# Patient Record
Sex: Female | Born: 1960 | Race: White | Hispanic: No | Smoking: Current every day smoker
Health system: Southern US, Community
[De-identification: ages and names within clinical notes are randomized; demographics above are authoritative.]

## PROBLEM LIST (undated history)

## (undated) DIAGNOSIS — E890 Postprocedural hypothyroidism: Secondary | ICD-10-CM

## (undated) DIAGNOSIS — I10 Essential (primary) hypertension: Secondary | ICD-10-CM

## (undated) DIAGNOSIS — E0590 Thyrotoxicosis, unspecified without thyrotoxic crisis or storm: Secondary | ICD-10-CM

## (undated) DIAGNOSIS — I2510 Atherosclerotic heart disease of native coronary artery without angina pectoris: Secondary | ICD-10-CM

## (undated) DIAGNOSIS — Z720 Tobacco use: Secondary | ICD-10-CM

## (undated) DIAGNOSIS — E785 Hyperlipidemia, unspecified: Secondary | ICD-10-CM

## (undated) HISTORY — PX: TONSILLECTOMY: SUR1361

---

## 1983-11-04 HISTORY — PX: TUBAL LIGATION: SHX77

## 2010-04-05 ENCOUNTER — Encounter (HOSPITAL_COMMUNITY): Admission: RE | Admit: 2010-04-05 | Discharge: 2010-06-04 | Payer: Self-pay | Admitting: Endocrinology

## 2014-04-26 ENCOUNTER — Observation Stay (HOSPITAL_COMMUNITY)
Admission: EM | Admit: 2014-04-26 | Discharge: 2014-04-27 | Disposition: A | Discharge status: Home / Self Care | Payer: 59 | Attending: Internal Medicine | Admitting: Internal Medicine

## 2014-04-26 ENCOUNTER — Encounter (HOSPITAL_COMMUNITY): Payer: Self-pay | Admitting: Emergency Medicine

## 2014-04-26 ENCOUNTER — Emergency Department (HOSPITAL_COMMUNITY): Payer: 59

## 2014-04-26 DIAGNOSIS — R079 Chest pain, unspecified: Secondary | ICD-10-CM | POA: Diagnosis present

## 2014-04-26 DIAGNOSIS — F17200 Nicotine dependence, unspecified, uncomplicated: Secondary | ICD-10-CM | POA: Insufficient documentation

## 2014-04-26 DIAGNOSIS — Z23 Encounter for immunization: Secondary | ICD-10-CM | POA: Insufficient documentation

## 2014-04-26 DIAGNOSIS — R0789 Other chest pain: Principal | ICD-10-CM | POA: Insufficient documentation

## 2014-04-26 DIAGNOSIS — R030 Elevated blood-pressure reading, without diagnosis of hypertension: Secondary | ICD-10-CM | POA: Insufficient documentation

## 2014-04-26 DIAGNOSIS — E0590 Thyrotoxicosis, unspecified without thyrotoxic crisis or storm: Secondary | ICD-10-CM | POA: Insufficient documentation

## 2014-04-26 DIAGNOSIS — M7989 Other specified soft tissue disorders: Secondary | ICD-10-CM | POA: Insufficient documentation

## 2014-04-26 DIAGNOSIS — Z720 Tobacco use: Secondary | ICD-10-CM | POA: Diagnosis present

## 2014-04-26 DIAGNOSIS — Z79899 Other long term (current) drug therapy: Secondary | ICD-10-CM | POA: Insufficient documentation

## 2014-04-26 DIAGNOSIS — I10 Essential (primary) hypertension: Secondary | ICD-10-CM | POA: Diagnosis present

## 2014-04-26 DIAGNOSIS — E785 Hyperlipidemia, unspecified: Secondary | ICD-10-CM | POA: Diagnosis present

## 2014-04-26 DIAGNOSIS — R0602 Shortness of breath: Secondary | ICD-10-CM | POA: Insufficient documentation

## 2014-04-26 DIAGNOSIS — E890 Postprocedural hypothyroidism: Secondary | ICD-10-CM | POA: Diagnosis present

## 2014-04-26 HISTORY — DX: Tobacco use: Z72.0

## 2014-04-26 HISTORY — DX: Hyperlipidemia, unspecified: E78.5

## 2014-04-26 HISTORY — DX: Thyrotoxicosis, unspecified without thyrotoxic crisis or storm: E05.90

## 2014-04-26 HISTORY — DX: Postprocedural hypothyroidism: E89.0

## 2014-04-26 HISTORY — DX: Essential (primary) hypertension: I10

## 2014-04-26 LAB — BASIC METABOLIC PANEL WITH GFR
BUN: 13 mg/dL (ref 6–23)
CO2: 26 meq/L (ref 19–32)
Calcium: 10.2 mg/dL (ref 8.4–10.5)
Chloride: 102 meq/L (ref 96–112)
Creatinine, Ser: 0.64 mg/dL (ref 0.50–1.10)
GFR calc Af Amer: >90 mL/min
GFR calc non Af Amer: >90 mL/min
Glucose, Bld: 102 mg/dL — ABNORMAL HIGH (ref 70–99)
Potassium: 3.9 meq/L (ref 3.7–5.3)
Sodium: 141 meq/L (ref 137–147)

## 2014-04-26 LAB — CBC
HCT: 40.8 % (ref 36.0–46.0)
Hemoglobin: 13.4 g/dL (ref 12.0–15.0)
MCH: 28.5 pg (ref 26.0–34.0)
MCHC: 32.8 g/dL (ref 30.0–36.0)
MCV: 86.8 fL (ref 78.0–100.0)
Platelets: 272 10*3/uL (ref 150–400)
RBC: 4.7 MIL/uL (ref 3.87–5.11)
RDW: 12.9 % (ref 11.5–15.5)
WBC: 9.4 10*3/uL (ref 4.0–10.5)

## 2014-04-26 LAB — TROPONIN I

## 2014-04-26 MED ORDER — SODIUM CHLORIDE 0.9 % IV SOLN: 250.0000 mL | INTRAVENOUS | Status: DC | PRN

## 2014-04-26 MED ORDER — ASPIRIN 81 MG PO CHEW
324.0000 mg | CHEWABLE_TABLET | Freq: Once | ORAL | Status: AC
  Administered 2014-04-26: 324 mg via ORAL
  Filled 2014-04-26: qty 4

## 2014-04-26 MED ORDER — NITROGLYCERIN 2 % TD OINT
1.0000 [in_us] | TOPICAL_OINTMENT | Freq: Four times a day (QID) | TRANSDERMAL | Status: DC
  Administered 2014-04-26 – 2014-04-27 (×2): 1 [in_us] via TOPICAL
  Filled 2014-04-26 (×2): qty 1

## 2014-04-26 MED ORDER — NICOTINE 14 MG/24HR TD PT24
14.0000 mg | MEDICATED_PATCH | Freq: Every day | TRANSDERMAL | Status: DC
Start: 2014-04-26 — End: 2014-04-27
  Filled 2014-04-26: qty 1

## 2014-04-26 MED ORDER — OXYCODONE HCL 5 MG PO TABS: 5.0000 mg | ORAL_TABLET | ORAL | Status: DC | PRN

## 2014-04-26 MED ORDER — SODIUM CHLORIDE 0.9 % IJ SOLN: 3.0000 mL | INTRAMUSCULAR | Status: DC | PRN

## 2014-04-26 MED ORDER — SODIUM CHLORIDE 0.9 % IJ SOLN
3.0000 mL | Freq: Two times a day (BID) | INTRAMUSCULAR | Status: DC
Start: 2014-04-26 — End: 2014-04-27
  Administered 2014-04-27: 3 mL via INTRAVENOUS

## 2014-04-26 MED ORDER — LEVOTHYROXINE SODIUM 50 MCG PO TABS
50.0000 ug | ORAL_TABLET | Freq: Every day | ORAL | Status: DC
  Administered 2014-04-27: 50 ug via ORAL
  Filled 2014-04-26: qty 1

## 2014-04-26 MED ORDER — SODIUM CHLORIDE 0.9 % IJ SOLN
3.0000 mL | Freq: Two times a day (BID) | INTRAMUSCULAR | Status: DC
Start: 2014-04-26 — End: 2014-04-27
  Administered 2014-04-26: 3 mL via INTRAVENOUS

## 2014-04-26 MED ORDER — HYDRALAZINE HCL 20 MG/ML IJ SOLN
5.0000 mg | Freq: Four times a day (QID) | INTRAMUSCULAR | Status: DC | PRN
  Administered 2014-04-26: 5 mg via INTRAVENOUS
  Filled 2014-04-26: qty 1

## 2014-04-26 MED ORDER — ACETAMINOPHEN 325 MG PO TABS
650.0000 mg | ORAL_TABLET | Freq: Four times a day (QID) | ORAL | Status: DC | PRN
  Administered 2014-04-27: 650 mg via ORAL
  Filled 2014-04-26: qty 2

## 2014-04-26 MED ORDER — HYDROMORPHONE HCL PF 1 MG/ML IJ SOLN: 0.5000 mg | INTRAMUSCULAR | Status: DC | PRN

## 2014-04-26 MED ORDER — ACETAMINOPHEN 650 MG RE SUPP: 650.0000 mg | Freq: Four times a day (QID) | RECTAL | Status: DC | PRN

## 2014-04-26 MED ORDER — ALUM & MAG HYDROXIDE-SIMETH 200-200-20 MG/5ML PO SUSP: 30.0000 mL | Freq: Four times a day (QID) | ORAL | Status: DC | PRN

## 2014-04-26 MED ORDER — ONDANSETRON HCL 4 MG/2ML IJ SOLN: 4.0000 mg | Freq: Four times a day (QID) | INTRAMUSCULAR | Status: DC | PRN

## 2014-04-26 MED ORDER — ASPIRIN EC 325 MG PO TBEC
325.0000 mg | DELAYED_RELEASE_TABLET | Freq: Every day | ORAL | Status: DC
Start: 2014-04-27 — End: 2014-04-27
  Administered 2014-04-27: 325 mg via ORAL
  Filled 2014-04-26: qty 1

## 2014-04-26 MED ORDER — ONDANSETRON HCL 4 MG PO TABS: 4.0000 mg | ORAL_TABLET | Freq: Four times a day (QID) | ORAL | Status: DC | PRN

## 2014-04-26 MED ORDER — ENOXAPARIN SODIUM 40 MG/0.4ML ~~LOC~~ SOLN
40.0000 mg | SUBCUTANEOUS | Status: DC
  Administered 2014-04-26: 40 mg via SUBCUTANEOUS
  Filled 2014-04-26: qty 0.4

## 2014-04-26 MED ORDER — PNEUMOCOCCAL VAC POLYVALENT 25 MCG/0.5ML IJ INJ
0.5000 mL | INJECTION | INTRAMUSCULAR | Status: AC
  Administered 2014-04-27: 0.5 mL via INTRAMUSCULAR
  Filled 2014-04-26: qty 0.5

## 2014-04-26 NOTE — ED Notes (Signed)
Pt denies pain at present.  Reporting she experiences intermittent pain, particularly when walking and carrying something or up an incline.  States that pain is a "burning tightness" and describes it in mid to left side of chest. No distress at present time.

## 2014-04-26 NOTE — ED Notes (Signed)
Left sided cp, intermittent, with mild sob x 2 wks.  Denies n/v/d/dizziness.

## 2014-04-26 NOTE — ED Provider Notes (Signed)
This chart was scribed for [place], DO, by Neta Ehlers, ED Scribe. This patient was seen in room APA12/APA12.   TIME SEEN: 8:32 PM  CHIEF COMPLAINT: Chest Pain    HPI:  Maureen Martin is a 53 y.o. female with a h/o hyperthyroidism who presents to the Emergency Department complaining of central and left-sided chest pain which radiates to her neck. The pt reports the chest pain has occurred intermittently for the past two weeks, but worsened this morning. Ms. Maureen Martin reports the chest pain causes her to sit down and she characterizes the pain as a "fist in the chest" as well as "heaviness."  The pt states the chest pain occurs with exertion; she denies experiencing the chest pain at rest. She reports associated diaphoresis, minimal shortness of breath, and mild swelling to her ankles, most noticeable in the evening. She denies radiation of the chest pain to her jaw. She also denies nausea, emesis, dizziness, a fever, or a cough. Denies a prior history of cardiac stress test. Furthermore, she denies a h/o DVT or PE,, fractures, long travels, surgeries, or hospitalizations. Not on exogenous estrogen. The pt currently smokes a pack a day. Ms. Maureen Martin states her paternal grandmother had an MI at age of 11.  Dr. Monico Blitz is her PCP.   ROS: See HPI Constitutional: positive diaphoresis, no fever  Eyes: no drainage  ENT: no runny nose   Cardiovascular:  chest pain  Resp: positive mild SOB, no cough GI: no vomiting, no nausea  GU: no dysuria Integumentary: no rash  Allergy: no hives  Musculoskeletal: positive lower leg swelling  Neurological: no dizziness, no slurred speech ROS otherwise negative  PAST MEDICAL HISTORY/PAST SURGICAL HISTORY:  Past Medical History  Diagnosis Date  . Hyperthyroidism     MEDICATIONS:  Prior to Admission medications   Medication Sig Start Date End Date Taking? Authorizing Irean Kendricks  Aspirin-Acetaminophen-Caffeine (GOODY HEADACHE PO) Take 1 packet by  mouth daily as needed (for pain).   Yes Historical Karenna Romanoff, MD  levothyroxine (SYNTHROID, LEVOTHROID) 50 MCG tablet Take 50 mcg by mouth daily.   Yes Historical Deleon Passe, MD    ALLERGIES:  Allergies  Allergen Reactions  . Prednisone Other (See Comments)    Made aggressive.    SOCIAL HISTORY:  History  Substance Use Topics  . Smoking status: Current Every Day Smoker    Types: Cigarettes  . Smokeless tobacco: Not on file  . Alcohol Use: No    FAMILY HISTORY: No family history on file.  EXAM: Triage Vitals: BP 177/84  Pulse 90  Temp(Src) 98 F (36.7 C) (Oral)  Resp 16  Ht 5\' 5"  (1.651 m)  Wt 200 lb (90.719 kg)  BMI 33.28 kg/m2  SpO2 96%  CONSTITUTIONAL: Alert and oriented and responds appropriately to questions. Well-appearing; well-nourished HEAD: Normocephalic EYES: Conjunctivae clear, PERRL ENT: normal nose; no rhinorrhea; moist mucous membranes; pharynx without lesions noted NECK: Supple, no meningismus, no LAD  CARD: RRR; S1 and S2 appreciated; no murmurs, no clicks, no rubs, no gallops RESP: Normal chest excursion without splinting or tachypnea; breath sounds clear and equal bilaterally; no wheezes, no rhonchi, no rales, no respiratory distress or hypoxia ABD/GI: Normal bowel sounds; non-distended; soft, non-tender, no rebound, no guarding BACK:  The back appears normal and is non-tender to palpation, there is no CVA tenderness EXT: Normal ROM in all joints; non-tender to palpation; no edema; normal capillary refill; no cyanosis    SKIN: Normal color for age and race; warm NEURO:  Moves all extremities equally PSYCH: The patient's mood and manner are appropriate. Grooming and personal hygiene are appropriate.  MEDICAL DECISION MAKING: Patient here with concerning story for ACS. She has no risk factors for pulmonary embolus. She has history of tobacco use, family history of cardiac disease. Labs are unremarkable. Troponin negative. EKG shows no ischemic changes.  Chest x-ray clear. I feel she needs admission for ACS rule out. Patient agrees.  ED PROGRESS: Discussed with Dr. Isaiah Serge with hospitalist service for admission for ACS rule out.     EKG Interpretation  Date/Time:  Wednesday April 26 2014 18:53:11 EDT Ventricular Rate:  86 PR Interval:  186 QRS Duration: 72 QT Interval:  348 QTC Calculation: 416 R Axis:   77 Text Interpretation:  Normal sinus rhythm Low voltage QRS Borderline ECG Confirmed by WARD,  DO, KRISTEN ([id number]) on 04/26/2014 7:21:04 PM         [place], DO 04/26/14 2112

## 2014-04-26 NOTE — H&P (Signed)
[facility] History and Physical  Maureen Martin ZOX:[id number] DOB: Sep 21, 1961 DOA: 04/26/2014  Referring physician:  EDP PCP: Monico Blitz, MD  Specialists:   Chief Complaint: Chest Pain  HPI: Maureen Martin is a 53 y.o. female with a history of Hyperthyroid S/P RAI Rx and now Hypothyroid who presents to the ED with complaints of 2 weeks of intermittent chest pain.  The chest pain is located in the left side of her chest and radiates into her neck and is associated with SOB and Diaphoresis.  The pain lasts for several minutes, and she describes the pain as heaviness and chest tightness.   She reports the pain occurs with exertion and improves with rest.   She rates the pain at a 7/10 at the worse.  She was evaluated in the ED and referred for a Cardiac rule out.       Review of Systems:  Constitutional: No Weight Loss, No Weight Gain, Night Sweats, Fevers, Chills, Fatigue, or Generalized Weakness HEENT: No Headaches, Difficulty Swallowing,Tooth/Dental Problems,Sore Throat,  No Sneezing, Rhinitis, Ear Ache, Nasal Congestion, or Post Nasal Drip,  Cardio-vascular:  +Chest pain, Orthopnea, PND, Edema in lower extremities, Anasarca, Dizziness, Palpitations  Resp: +Dyspnea, No DOE, No Cough, No Hemoptysis,  No Wheezing.    GI: No Heartburn, Indigestion, Abdominal Pain, Nausea, Vomiting, Diarrhea, Change in Bowel Habits,  Loss of Appetite  GU: No Dysuria, Change in Color of Urine, No Urgency or Frequency.  No flank pain.  Musculoskeletal: No Joint Pain or Swelling.  No Decreased Range of Motion. No Back Pain.  Neurologic: No Syncope, No Seizures, Muscle Weakness, Paresthesia, Vision Disturbance or Loss, No Diplopia, No Vertigo, No Difficulty Walking,  Skin: No Rash or Lesions. Psych: No Change in Mood or Affect. No Depression or Anxiety. No Memory loss. No Confusion or Hallucinations   Past Medical History  Diagnosis Date  . Hyperthyroidism     Past Surgical History  Procedure  Laterality Date  . Cesarean section      x 2  . Tonsillectomy       Prior to Admission medications   Medication Sig Start Date End Date Taking? Authorizing Provider  Aspirin-Acetaminophen-Caffeine (GOODY HEADACHE PO) Take 1 packet by mouth daily as needed (for pain).   Yes Historical Provider, MD  levothyroxine (SYNTHROID, LEVOTHROID) 50 MCG tablet Take 50 mcg by mouth daily.   Yes Historical Provider, MD    Allergies  Allergen Reactions  . Prednisone Other (See Comments)    Made aggressive.    Social History:  reports that she has been smoking Cigarettes.  She has been smoking about 0.00 packs per day. She does not have any smokeless tobacco history on file. She reports that she does not drink alcohol or use illicit drugs.     Family History:     CAD in  Paternal [place] mother   CAD in  Father    Physical Exam:  GEN:  Pleasant Obese  53 y.o. Caucasian female examined and in no acute distress; cooperative with exam Filed Vitals:   04/26/14 1858  BP: 177/84  Pulse: 90  Temp: 98 F (36.7 C)  TempSrc: Oral  Resp: 16  Height: 5\' 5"  (1.651 m)  Weight: 90.719 kg (200 lb)  SpO2: 96%   Blood pressure 177/84, pulse 90, temperature 98 F (36.7 C), temperature source Oral, resp. rate 16, height 5\' 5"  (1.651 m), weight 90.719 kg (200 lb), SpO2 96.00%. PSYCH: She is alert and oriented x4; does not appear anxious  does not appear depressed; affect is normal HEENT: Normocephalic and Atraumatic, Mucous membranes pink; PERRLA; EOM intact; Fundi:  Benign;  No scleral icterus, Nares: Patent, Oropharynx: Clear, Fair Dentition, Neck:  FROM, no cervical lymphadenopathy nor thyromegaly or carotid bruit; no JVD; Breasts:: Not examined CHEST WALL: No tenderness CHEST: Normal respiration, clear to auscultation bilaterally HEART: Regular rate and rhythm; no murmurs rubs or gallops BACK: No kyphosis or scoliosis; no CVA tenderness ABDOMEN: Positive Bowel Sounds, Obese, soft non-tender; no  masses, no organomegaly. Rectal Exam: Not done EXTREMITIES: No cyanosis, clubbing or edema; no ulcerations. Genitalia: not examined PULSES: 2+ and symmetric SKIN: Normal hydration no rash or ulceration CNS:  Alert and Oriented x 4, No Focal Deficits.    Vascular: 2+ pulses palpable throughout     Labs on Admission:  Basic Metabolic Panel:  Recent Labs Lab 04/26/14 1915  NA 141  K 3.9  CL 102  CO2 26  GLUCOSE 102*  BUN 13  CREATININE 0.64  CALCIUM 10.2   Liver Function Tests: No results found for this basename: AST, ALT, ALKPHOS, BILITOT, PROT, ALBUMIN,  in the last 168 hours No results found for this basename: LIPASE, AMYLASE,  in the last 168 hours No results found for this basename: AMMONIA,  in the last 168 hours CBC:  Recent Labs Lab 04/26/14 1915  WBC 9.4  HGB 13.4  HCT 40.8  MCV 86.8  PLT 272   Cardiac Enzymes:  Recent Labs Lab 04/26/14 1915  TROPONINI <0.30    BNP (last 3 results) No results found for this basename: PROBNP,  in the last 8760 hours CBG: No results found for this basename: GLUCAP,  in the last 168 hours  Radiological Exams on Admission: Dg Chest 2 View  04/26/2014   CLINICAL DATA:  Intermittent chest pain.  EXAM: CHEST  2 VIEW  COMPARISON:  None.  FINDINGS: The cardiac silhouette, mediastinal and hilar contours are within normal limits. There are emphysematous and bronchitic lung changes likely related to smoking. No infiltrates, edema or effusions. The bony thorax is intact.  IMPRESSION: Mild emphysematous and bronchitic type lung changes, likely related to smoking.  No acute pulmonary findings.   Electronically Signed   By: Kalman Jewels M.D.   On: 04/26/2014 19:25     EKG: Independently reviewed. Normal Sinus Rhythm  Rate =86,  ST Depression in Infero-Lateral leads.     Assessment/Plan:   53 y.o. female with  Principal Problem:   Chest pain Active Problems:   Elevated blood pressure reading without diagnosis of  hypertension   Hypothyroidism, postradioiodine therapy   Tobacco abuse     1.   Chest pain-  Telemetry Monitoring,  Cycle Troponins,  Apply Nitropaste 1 inch q 6 hrs (with Hold parameters if SBP < 100), Administer ASA and Oxygen Rx.      Further Risk Stratification:  Check Fasting Lipids, and HbA1c.  Counsel and Advise Tobacco Cessation.   2 D ECHO study in AM.     Stress Testing as Inpatient vs Outpatient.    2.   Elevated Blood Pressure without Dx of HTN-   Monitor,  May be reactive.  Control pain, and NTG paste ordered .    3.   Hypothyroid-  Continue Levothyroxine , and check TSH level.    4.   Tobacco Abuse- Counseled, is Contemplative,  Nicotine patch while in hospital.     5.  DVT Prophylaxis with Lovenox.       Code Status:  FULL CODE Family Communication:    No Family Present Disposition Plan:    Observation  Time spent:  [address] [place] [address] [facility] Pager [phone]  If 7PM-7AM, please contact night-coverage [email or web address] Password [facility] 04/26/2014, 9:36 PM

## 2014-04-26 NOTE — ED Notes (Signed)
Hospitalist at bedside 

## 2014-04-27 ENCOUNTER — Encounter (HOSPITAL_COMMUNITY): Payer: Self-pay | Admitting: Internal Medicine

## 2014-04-27 DIAGNOSIS — I10 Essential (primary) hypertension: Secondary | ICD-10-CM | POA: Diagnosis present

## 2014-04-27 DIAGNOSIS — E785 Hyperlipidemia, unspecified: Secondary | ICD-10-CM

## 2014-04-27 DIAGNOSIS — I517 Cardiomegaly: Secondary | ICD-10-CM

## 2014-04-27 HISTORY — DX: Essential (primary) hypertension: I10

## 2014-04-27 HISTORY — DX: Hyperlipidemia, unspecified: E78.5

## 2014-04-27 LAB — BASIC METABOLIC PANEL
BUN: 11 mg/dL (ref 6–23)
CALCIUM: 9.3 mg/dL (ref 8.4–10.5)
CHLORIDE: 102 meq/L (ref 96–112)
CO2: 27 meq/L (ref 19–32)
CREATININE: 0.66 mg/dL (ref 0.50–1.10)
GFR calc Af Amer: >90 mL/min (ref >90)
GFR calc non Af Amer: >90 mL/min (ref >90)
GLUCOSE: 95 mg/dL (ref 70–99)
Potassium: 3.8 mEq/L (ref 3.7–5.3)
Sodium: 141 mEq/L (ref 137–147)

## 2014-04-27 LAB — CBC
HEMATOCRIT: 39.1 % (ref 36.0–46.0)
Hemoglobin: 12.8 g/dL (ref 12.0–15.0)
MCH: 28.6 pg (ref 26.0–34.0)
MCHC: 32.7 g/dL (ref 30.0–36.0)
MCV: 87.5 fL (ref 78.0–100.0)
PLATELETS: 247 10*3/uL (ref 150–400)
RBC: 4.47 MIL/uL (ref 3.87–5.11)
RDW: 13 % (ref 11.5–15.5)
WBC: 9.4 10*3/uL (ref 4.0–10.5)

## 2014-04-27 LAB — LIPID PANEL
CHOLESTEROL: 269 mg/dL — AB (ref 0–200)
HDL: 43 mg/dL (ref >39)
LDL Cholesterol: 189 mg/dL — ABNORMAL HIGH (ref 0–99)
Total CHOL/HDL Ratio: 6.3 RATIO
Triglycerides: 183 mg/dL — ABNORMAL HIGH (ref <150)
VLDL: 37 mg/dL (ref 0–40)

## 2014-04-27 LAB — TROPONIN I
Troponin I: <0.3 ng/mL (ref <0.30)
Troponin I: <0.3 ng/mL (ref <0.30)

## 2014-04-27 LAB — HEMOGLOBIN A1C
Hgb A1c MFr Bld: 5.7 % — ABNORMAL HIGH (ref <5.7)
Mean Plasma Glucose: 117 mg/dL — ABNORMAL HIGH (ref <117)

## 2014-04-27 LAB — TSH: TSH: 6.53 u[IU]/mL — ABNORMAL HIGH (ref 0.350–4.500)

## 2014-04-27 MED ORDER — ATORVASTATIN CALCIUM 10 MG PO TABS: 10.0000 mg | ORAL_TABLET | Freq: Every day | ORAL | Status: DC

## 2014-04-27 MED ORDER — ASPIRIN EC 81 MG PO TBEC: 81.0000 mg | DELAYED_RELEASE_TABLET | Freq: Every day | ORAL | Status: DC

## 2014-04-27 MED ORDER — ACETAMINOPHEN 500 MG PO TABS
500.0000 mg | ORAL_TABLET | Freq: Four times a day (QID) | ORAL | Status: AC | PRN
Start: 2014-04-27 — End: ?

## 2014-04-27 MED ORDER — SIMVASTATIN 20 MG PO TABS: 20.0000 mg | ORAL_TABLET | Freq: Every day | ORAL | Status: DC

## 2014-04-27 MED ORDER — PANTOPRAZOLE SODIUM 40 MG PO TBEC: 40.0000 mg | DELAYED_RELEASE_TABLET | Freq: Every day | ORAL | Status: DC

## 2014-04-27 MED ORDER — HYDROCHLOROTHIAZIDE 12.5 MG PO CAPS: 12.5000 mg | ORAL_CAPSULE | Freq: Every day | ORAL | Status: DC

## 2014-04-27 MED ORDER — HYDROCHLOROTHIAZIDE 12.5 MG PO CAPS
12.5000 mg | ORAL_CAPSULE | Freq: Every day | ORAL | Status: DC
  Administered 2014-04-27: 12.5 mg via ORAL
  Filled 2014-04-27: qty 1

## 2014-04-27 NOTE — Progress Notes (Signed)
Patient with orders to be discharge home. Discharge instructions given, patient verbalized understanding. Patient stable. Patient left in private vehicle with daughter.  

## 2014-04-27 NOTE — Progress Notes (Signed)
*  PRELIMINARY RESULTS* Echocardiogram TTE  has been performed.  Maureen Martin 04/27/2014, 12:28 PM

## 2014-04-27 NOTE — Plan of Care (Signed)
Problem: Consults Goal: Chest Pain Patient Education (See Patient Education module for education specifics.)  Outcome: Progressing Observation to rule out MI, troponins negative x2 at present, no complaints of pain since admission to the floor Goal: Tobacco Cessation referral if indicated Outcome: Progressing Patient states she has quit several times in the past and presently smoking. Goal: Nutrition Consult-if indicated Outcome: Progressing Patient is obese and poor eating habits  Problem: Phase I Progression Outcomes Goal: Hemodynamically stable Outcome: Progressing Admitted with hypertension, hydralazine 74m one time IV given, no further issues with BP Goal: Anginal pain relieved Outcome: Completed/Met Date Met:  04/27/14 No complaints of pain on the floor Goal: Aspirin unless contraindicated Outcome: Completed/Met Date Met:  04/27/14 Given in ED 3252m chewable

## 2014-04-27 NOTE — Progress Notes (Signed)
Utilization review completed.  

## 2014-04-30 NOTE — Discharge Summary (Signed)
Physician Discharge Summary  Maureen Martin VZD:[id number] DOB: 07-Jun-1961 DOA: 04/26/2014  PCP: Monico Blitz, MD  Admit date: 04/26/2014 Discharge date: 04/27/2014  Time spent: Greater than 30 minutes  Recommendations for Outpatient Follow-up:  1. Echo and TSH pending at the time of discharge.    Discharge Diagnoses:  1. Left-sided chest pain; etiology unknown; MI ruled out. 2. Hypertension 3.Hypothyroidism, postradioiodine therapy 4.Tobacco abuse; advised to stop smoking. 5. Hyperlipidemia; total cholesterol 259; TG 183; HDL 43; LDL 189 6. Chronic NSAID use for chronic headaches   Discharge Condition: Improved  Diet recommendation: Herat healthy  Filed Weights   04/26/14 1858 04/26/14 2228  Weight: 90.719 kg (200 lb) 92.5 kg (203 lb 14.8 oz)    History of present illness:  Maureen Martin is a 53 y.o. female with a history of Hyperthyroid S/P RAI Rx and now Hypothyroid who presented to the ED with complaints of 2 weeks of intermittent chest pain. The chest pain was located in the left side of her chest and radiated into her neck and was associated with SOB and Diaphoresis. The pain lasted for several minutes. She described the pain as heaviness and chest tightness. She reported the pain occurs with exertion and improves with rest. She rated the pain at a 7/10 at the worse. She was evaluated in the ED and referred for a cardiac rule out.   Hospital Course:  The patient was afebrile and oxygenating 96-100% on room air.Her blood pressure was elevated at 177/84. She denied any prior history of hypertension. Management was started with Nitropaste, aspirin, oxygen and as needed Dilaudid. Synthroid was continued. A nicotine patch was placed and she was counseled on tobacco cessation. As needed IV Hydralazine was also ordered. For further evaluation, a number of studies were ordered. Her Troponin I was negative x 4. Her A1c was within normal limits at 5.7. The results of her lipid  profile were dictated above. She was started on statin therapy. The results of her TSH and echo were pending at the time of discharge. The patient was informed that the dictating physician would follow up on the results when available.  She became chest pain free and Nitropaste was discontinued. Her blood pressure improved, but 12.5 of HCTZ was added for treatment of potential HTN. Further questioning revealed that the patient had been taking Goody's powder for headaches. She was instructed to wean herself off of Goody's powder and to try extra strength Tylenol instead. Protonix was prescribed at the time of discharge. An outpatient cardiology was scheduled for further evaluation.   Procedures:  2-D echo. Pending.  Consultations:  None  Discharge Exam: Filed Vitals:   04/27/14 0621  BP: 131/72  Pulse: 60  Temp: 97.7 F (36.5 C)  Resp: 20    General: No acute distress Cardiovascular: S1 S2 with no murmurs, rubs or gallops. Respiratory: CTA bilaterally  Extremities: no pedal edema; no calf tenderness.  Discharge Instructions You were cared for by a hospitalist during your hospital stay. If you have any questions about your discharge medications or the care you received while you were in the hospital after you are discharged, you can call the unit and asked to speak with the hospitalist on call if the hospitalist that took care of you is not available. Once you are discharged, your primary care physician will handle any further medical issues. Please note that NO REFILLS for any discharge medications will be authorized once you are discharged, as it is imperative that you return to  your primary care physician (or establish a relationship with a primary care physician if you do not have one) for your aftercare needs so that they can reassess your need for medications and monitor your lab values.  Discharge Instructions   Diet - low sodium heart healthy    Complete by:  As directed       Discharge instructions    Complete by:  As directed   Stop smoking. Wean yourself off of Goody's Powders.     Increase activity slowly    Complete by:  As directed             Medication List    STOP taking these medications       GOODY HEADACHE PO      TAKE these medications       acetaminophen 500 MG tablet  Commonly known as:  TYLENOL  Take 1-2 tablets (500-1,000 mg total) by mouth every 6 (six) hours as needed.     aspirin EC 81 MG tablet  Take 1 tablet (81 mg total) by mouth daily.     hydrochlorothiazide 12.5 MG capsule  Commonly known as:  MICROZIDE  Take 1 capsule (12.5 mg total) by mouth daily.     levothyroxine 50 MCG tablet  Commonly known as:  SYNTHROID, LEVOTHROID  Take 50 mcg by mouth daily.     pantoprazole 40 MG tablet  Commonly known as:  PROTONIX  Take 1 tablet (40 mg total) by mouth daily.     simvastatin 20 MG tablet  Commonly known as:  ZOCOR  Take 1 tablet (20 mg total) by mouth daily.       Allergies  Allergen Reactions  . Prednisone Other (See Comments)    Made aggressive.       Follow-up Information   Follow up with Arnoldo Lenis, MD On 05/02/2014. (at 9:20 am in [place] office)    Specialty:  Cardiology   Contact information:   [address] [place] [address] [phone]        The results of significant diagnostics from this hospitalization (including imaging, microbiology, ancillary and laboratory) are listed below for reference.    Significant Diagnostic Studies: Dg Chest 2 View  04/26/2014   CLINICAL DATA:  Intermittent chest pain.  EXAM: CHEST  2 VIEW  COMPARISON:  None.  FINDINGS: The cardiac silhouette, mediastinal and hilar contours are within normal limits. There are emphysematous and bronchitic lung changes likely related to smoking. No infiltrates, edema or effusions. The bony thorax is intact.  IMPRESSION: Mild emphysematous and bronchitic type lung changes, likely related to smoking.  No acute  pulmonary findings.   Electronically Signed   By: Kalman Jewels M.D.   On: 04/26/2014 19:25    Microbiology: No results found for this or any previous visit (from the past 240 hour(s)).   Labs: Basic Metabolic Panel:  Recent Labs Lab 04/26/14 1915 04/27/14 0554  NA 141 141  K 3.9 3.8  CL 102 102  CO2 26 27  GLUCOSE 102* 95  BUN 13 11  CREATININE 0.64 0.66  CALCIUM 10.2 9.3   Liver Function Tests: No results found for this basename: AST, ALT, ALKPHOS, BILITOT, PROT, ALBUMIN,  in the last 168 hours No results found for this basename: LIPASE, AMYLASE,  in the last 168 hours No results found for this basename: AMMONIA,  in the last 168 hours CBC:  Recent Labs Lab 04/26/14 1915 04/27/14 0554  WBC 9.4 9.4  HGB 13.4 12.8  HCT 40.8 39.1  MCV 86.8 87.5  PLT 272 247   Cardiac Enzymes:  Recent Labs Lab 04/26/14 1915 04/26/14 2330 04/27/14 0554 04/27/14 1153  TROPONINI <0.30 <0.30 <0.30 <0.30   BNP: BNP (last 3 results) No results found for this basename: PROBNP,  in the last 8760 hours CBG: No results found for this basename: GLUCAP,  in the last 168 hours     Signed:  FISHER,DENISE  [facility] 04/27/2014, 1:00 PM

## 2014-05-02 ENCOUNTER — Ambulatory Visit (INDEPENDENT_AMBULATORY_CARE_PROVIDER_SITE_OTHER): Payer: 59 | Admitting: Cardiology

## 2014-05-02 ENCOUNTER — Encounter: Payer: Self-pay | Admitting: Cardiology

## 2014-05-02 VITALS — BP 131/84 | HR 76 | Ht 66.0 in | Wt 201.0 lb

## 2014-05-02 DIAGNOSIS — R0789 Other chest pain: Secondary | ICD-10-CM

## 2014-05-02 NOTE — Progress Notes (Signed)
Clinical Summary Maureen Martin is a 53 y.o.female seen today as a new patient for the following medical problems.  1. Chest pain - recent admit to [facility] 04/2014 with chest pain - trop neg x 4, EKG non-specific changes - echo 04/27/14 showed LVEF 40-98%, grade I diastolic dysfunction, no WMAs  - episodes of chest pain over 2 weeks, 4 episodes. Burning pressure midchest, 4/10. Can occur at rest or with exertion. No SOB, no palpitations, no N/V. + diaphoresis. Nothing made better or worst. Lasted just a few minutes. Last episode was 04/27/14. - denies any DOE. - started on antacid and has stopped taking frequent goodies powder, has not had any symptoms since these changes.   CAD risk factors: HTN, HL, + tobacco, father MI 7s, maternal grandmother unsure of age.     Past Medical History  Diagnosis Date  . Hyperthyroidism   . Hypothyroidism, postradioiodine therapy   . Tobacco abuse   . Hyperlipidemia 04/27/2014  . HTN (hypertension) 04/27/2014     Allergies  Allergen Reactions  . Prednisone Other (See Comments)    Made aggressive.     Current Outpatient Prescriptions  Medication Sig Dispense Refill  . acetaminophen (TYLENOL) 500 MG tablet Take 1-2 tablets (500-1,000 mg total) by mouth every 6 (six) hours as needed.      Marland Kitchen aspirin EC 81 MG tablet Take 1 tablet (81 mg total) by mouth daily.      . hydrochlorothiazide (MICROZIDE) 12.5 MG capsule Take 1 capsule (12.5 mg total) by mouth daily.  30 capsule  3  . levothyroxine (SYNTHROID, LEVOTHROID) 50 MCG tablet Take 50 mcg by mouth daily.      . pantoprazole (PROTONIX) 40 MG tablet Take 1 tablet (40 mg total) by mouth daily.  30 tablet  3  . simvastatin (ZOCOR) 20 MG tablet Take 1 tablet (20 mg total) by mouth daily.  30 tablet  3   No current facility-administered medications for this visit.     Past Surgical History  Procedure Laterality Date  . Cesarean section      x 2  . Tonsillectomy       Allergies    Allergen Reactions  . Prednisone Other (See Comments)    Made aggressive.      Family History  Problem Relation Age of Onset  . Coronary artery disease Father   . Valvular heart disease Father      Social History Ms. Daw reports that she has been smoking Cigarettes.  She has been smoking about 0.00 packs per day for the past 20 years. She does not have any smokeless tobacco history on file. Ms. Severin reports that she does not drink alcohol.   Review of Systems CONSTITUTIONAL: No weight loss, fever, chills, weakness or fatigue.  HEENT: Eyes: No visual loss, blurred vision, double vision or yellow sclerae.No hearing loss, sneezing, congestion, runny nose or sore throat.  SKIN: No rash or itching.  CARDIOVASCULAR: per HPI RESPIRATORY: No shortness of breath, cough or sputum.  GASTROINTESTINAL: No anorexia, nausea, vomiting or diarrhea. No abdominal pain or blood.  GENITOURINARY: No burning on urination, no polyuria NEUROLOGICAL: No headache, dizziness, syncope, paralysis, ataxia, numbness or tingling in the extremities. No change in bowel or bladder control.  MUSCULOSKELETAL: No muscle, back pain, joint pain or stiffness.  LYMPHATICS: No enlarged nodes. No history of splenectomy.  PSYCHIATRIC: No history of depression or anxiety.  ENDOCRINOLOGIC: No reports of sweating, cold or heat intolerance. No polyuria or polydipsia.  Marland Kitchen  Physical Examination p 76 bp 131/84 Wt 201 lbs BMI 32 Gen: resting comfortably, no acute distress HEENT: no scleral icterus, pupils equal round and reactive, no palptable cervical adenopathy,  CV: RRR, no m/r/g, no JVD, no carotid bruits Resp: Clear to auscultation bilaterally GI: abdomen is soft, non-tender, non-distended, normal bowel sounds, no hepatosplenomegaly MSK: extremities are warm, no edema.  Skin: warm, no rash Neuro:  no focal deficits Psych: appropriate affect   Diagnostic Studies 04/27/14 Echo Study Conclusions  -  Procedure narrative: Transthoracic echocardiography. Image quality was suboptimal, due to poor sound transmission. - Left ventricle: The cavity size was normal. Wall thickness was increased in a pattern of mild LVH. Systolic function was normal. The estimated ejection fraction was in the range of 60% to 65%. Wall motion was normal; there were no regional wall motion abnormalities. Doppler parameters are consistent with abnormal left ventricular relaxation (grade 1 diastolic dysfunction).      Assessment and Plan   1. Chest pain - resolved since stopping goodys powder and starting ppi, overall consistent with likely GI source - no further cardiac workup at this time, I have asked her if symptoms return to contact us and we can consider further cardiac testing at that time  F/u 6 months     Arnoldo Lenis, M.D., F.A.C.C.

## 2014-05-02 NOTE — Patient Instructions (Signed)
Continue all current medications. Your physician wants you to follow up in: 6 months.  You will receive a reminder letter in the mail one-two months in advance.  If you don't receive a letter, please call our office to schedule the follow up appointment   

## 2014-11-15 ENCOUNTER — Ambulatory Visit (INDEPENDENT_AMBULATORY_CARE_PROVIDER_SITE_OTHER): Payer: 59 | Admitting: Cardiology

## 2014-11-15 ENCOUNTER — Encounter: Payer: Self-pay | Admitting: Cardiology

## 2014-11-15 VITALS — BP 138/82 | HR 86 | Ht 66.0 in | Wt 194.0 lb

## 2014-11-15 DIAGNOSIS — R079 Chest pain, unspecified: Secondary | ICD-10-CM

## 2014-11-15 MED ORDER — NITROGLYCERIN 0.4 MG SL SUBL: 0.4000 mg | SUBLINGUAL_TABLET | SUBLINGUAL | Status: AC | PRN

## 2014-11-15 MED ORDER — SIMVASTATIN 20 MG PO TABS: 20.0000 mg | ORAL_TABLET | Freq: Every day | ORAL | Status: DC

## 2014-11-15 NOTE — Patient Instructions (Signed)
Your physician recommends that you schedule a follow-up appointment to be determined after stress test  Your physician has recommended you make the following change in your medication:   [place]  Your physician recommends that you return for lab work CBC/CMP/LIPIDS/TSH  CONTINUE ALL OTHER CURRENT MEDICATIONS  Thank you for choosing [facility]!!

## 2014-11-15 NOTE — Progress Notes (Signed)
Clinical Summary Ms. Mutz is a 54 y.o.female seen today for follow up of the following medical problems.   1. Chest pain - admit to [facility] 04/2014 with chest pain - trop neg x 4, EKG non-specific changes - echo 04/27/14 showed LVEF 93-81%, grade I diastolic dysfunction, no WMAs CAD risk factors: HTN, HL, + tobacco, father MI 78s, maternal grandmother unsure of age.   - at our last visit symptoms had resolved after stopping goodies powder and starting ppi, suspected GI source.  - since then she reports recurrence of symptoms. Dull pain left chest, 6/10. Tends to occur with exertion. No other associated symptoms. Lasts 2 minutes. Not positional. Occurs every 2-3 days. Denies any DOE.   Past Medical History  Diagnosis Date  . Hyperthyroidism   . Hypothyroidism, postradioiodine therapy   . Tobacco abuse   . Hyperlipidemia 04/27/2014  . HTN (hypertension) 04/27/2014     Allergies  Allergen Reactions  . Prednisone Other (See Comments)    Made aggressive.     Current Outpatient Prescriptions  Medication Sig Dispense Refill  . acetaminophen (TYLENOL) 500 MG tablet Take 1-2 tablets (500-1,000 mg total) by mouth every 6 (six) hours as needed.    Marland Kitchen aspirin EC 81 MG tablet Take 1 tablet (81 mg total) by mouth daily.    . hydrochlorothiazide (MICROZIDE) 12.5 MG capsule Take 1 capsule (12.5 mg total) by mouth daily. 30 capsule 3  . levothyroxine (SYNTHROID, LEVOTHROID) 50 MCG tablet Take 50 mcg by mouth daily.    . pantoprazole (PROTONIX) 40 MG tablet Take 1 tablet (40 mg total) by mouth daily. 30 tablet 3  . simvastatin (ZOCOR) 20 MG tablet Take 1 tablet (20 mg total) by mouth daily. 30 tablet 3   No current facility-administered medications for this visit.     Past Surgical History  Procedure Laterality Date  . Cesarean section      x 2  . Tonsillectomy       Allergies  Allergen Reactions  . Prednisone Other (See Comments)    Made aggressive.      Family  History  Problem Relation Age of Onset  . Coronary artery disease Father   . Valvular heart disease Father      Social History Ms. Kandler reports that she has been smoking Cigarettes.  She started smoking about 21 years ago. She has a 15 pack-year smoking history. She has never used smokeless tobacco. Ms. Rubano reports that she does not drink alcohol.   Review of Systems CONSTITUTIONAL: No weight loss, fever, chills, weakness or fatigue.  HEENT: Eyes: No visual loss, blurred vision, double vision or yellow sclerae.No hearing loss, sneezing, congestion, runny nose or sore throat.  SKIN: No rash or itching.  CARDIOVASCULAR: per HPI RESPIRATORY: No shortness of breath, cough or sputum.  GASTROINTESTINAL: No anorexia, nausea, vomiting or diarrhea. No abdominal pain or blood.  GENITOURINARY: No burning on urination, no polyuria NEUROLOGICAL: No headache, dizziness, syncope, paralysis, ataxia, numbness or tingling in the extremities. No change in bowel or bladder control.  MUSCULOSKELETAL: No muscle, back pain, joint pain or stiffness.  LYMPHATICS: No enlarged nodes. No history of splenectomy.  PSYCHIATRIC: No history of depression or anxiety.  ENDOCRINOLOGIC: No reports of sweating, cold or heat intolerance. No polyuria or polydipsia.  Marland Kitchen   Physical Examination p 86 bp 138/82 Wt 194 lbs BMI 31 Gen: resting comfortably, no acute distress HEENT: no scleral icterus, pupils equal round and reactive, no palptable cervical adenopathy,  CV: RRR, no m/r/g, no JVD, no carotid bruits Resp: Clear to auscultation bilaterally GI: abdomen is soft, non-tender, non-distended, normal bowel sounds, no hepatosplenomegaly MSK: extremities are warm, no edema.  Skin: warm, no rash Neuro:  no focal deficits Psych: appropriate affect   Diagnostic Studies 04/2014 Echo Study Conclusions  - Procedure narrative: Transthoracic echocardiography. Image quality was suboptimal, due to poor sound  transmission. - Left ventricle: The cavity size was normal. Wall thickness was increased in a pattern of mild LVH. Systolic function was normal. The estimated ejection fraction was in the range of 60% to 65%. Wall motion was normal; there were no regional wall motion abnormalities. Doppler parameters are consistent with abnormal left ventricular relaxation (grade 1 diastolic dysfunction).    Assessment and Plan  1. Chest pain - initially resolved after stopping goodies powder and starting ppi, however have returned - exact etiology remains unclear, she does have several CAD risk factors - will refer for GXT to further evaluate possible cardiac cause. Given prescription for prn SL NG.  - she has asked that we also check her routine labs, will order   F/u pending stress test results      Arnoldo Lenis, M.D.

## 2014-11-24 ENCOUNTER — Inpatient Hospital Stay (HOSPITAL_COMMUNITY): Admission: RE | Admit: 2014-11-24 | Payer: 59 | Source: Ambulatory Visit

## 2014-11-27 ENCOUNTER — Ambulatory Visit (HOSPITAL_COMMUNITY): Admission: RE | Admit: 2014-11-27 | Payer: 59 | Source: Ambulatory Visit

## 2014-12-01 ENCOUNTER — Ambulatory Visit (HOSPITAL_COMMUNITY)
Admission: RE | Admit: 2014-12-01 | Discharge: 2014-12-01 | Disposition: A | Discharge status: Home / Self Care | Payer: 59 | Source: Ambulatory Visit | Attending: Cardiology | Admitting: Cardiology

## 2014-12-01 ENCOUNTER — Telehealth: Payer: Self-pay | Admitting: *Deleted

## 2014-12-01 ENCOUNTER — Encounter (HOSPITAL_COMMUNITY): Payer: Self-pay

## 2014-12-01 DIAGNOSIS — R079 Chest pain, unspecified: Secondary | ICD-10-CM

## 2014-12-01 DIAGNOSIS — R9439 Abnormal result of other cardiovascular function study: Secondary | ICD-10-CM

## 2014-12-01 NOTE — Telephone Encounter (Signed)
-----   Message from Arnoldo Lenis, MD sent at 12/01/2014 12:22 PM EST ----- Exercise stress test does show some moderate abnormalities suggestive of a possible blockage, She needs a more detailed stress test to further evaluate, please order a [place] for her.   Zandra Abts MD

## 2014-12-01 NOTE — Telephone Encounter (Signed)
-----   Message from Arnoldo Lenis, MD sent at 12/01/2014 12:22 PM EST ----- Exercise stress test does show some moderate abnormalities suggestive of a possible blockage, She needs a more detailed stress test to further evaluate, please order a [place] for her.   Zandra Abts MD

## 2014-12-01 NOTE — Telephone Encounter (Signed)
Orders in pt made aware, forwarded to schedulers.

## 2014-12-01 NOTE — Telephone Encounter (Signed)
Pt made aware verbally understood, forwarded to Dr. Manuella Ghazi. Orders in for Lexi. Forwarded to [employer]

## 2014-12-01 NOTE — Progress Notes (Addendum)
Stress Lab Nurses Notes - [place] 12/01/2014 Reason for doing test: Chest Pain Type of test: Regular GTX Nurse performing test: Gerrit Halls, RN Nuclear Medicine Tech: Not Applicable Echo Tech: Not Applicable MD performing test: Rob Mciver/K.Purcell Nails NP Family MD: Manuella Ghazi Test explained and consent signed: Yes.   IV started: No IV started Symptoms: "Burning in chest" fatigue Treatment/Intervention: None Reason test stopped: fatigue After recovery IV was: NA Patient to return to Nuc. Med at : NA Patient discharged: Home Patient's Condition upon discharge was: stable Comments: During test peak BP 191/110 & HR 146. Recovery BP 146/98 & HR 82.  Symptoms resolved in recovery. Maureen Martin T   The patient was exercised according to the Bruce protocol for 3 min achieving a work level for 4.6 METs. Baseline HR increased from 71 bpm to 146 bpm(87% THR) and bp increased from 150/80 up to 192/102. The test was stopped due to fatigue and chest pressure. Baseline EKG showed NSR. Stress EKG showed TWI and 1 mm horizontal ST depressions in the lateral leads. There were PACs and PVCs but no significant arrhythmias.   1. Abnormal exercise stress EKG with 1 mm ST depressions in the lateral limb leads 2. Limiting chest pain with exertion 3. Duke treadmill score of -10, indicating intermediate risk for major cardiac events 4. Poor exercise functional capacity (60% of predicted based on age and gender 28. Consider stress imaging to better evaluate CAD risk   Zandra Abts MD

## 2014-12-04 ENCOUNTER — Telehealth: Payer: Self-pay | Admitting: *Deleted

## 2014-12-04 ENCOUNTER — Encounter: Payer: Self-pay | Admitting: *Deleted

## 2014-12-04 NOTE — Telephone Encounter (Signed)
-----   Message from Weston Anna sent at 12/04/2014  2:05 PM EST -----   ----- Message -----    From: Lestine Mount    Sent: 12/04/2014   1:46 PM      To: Weston Anna  12/08/14 arrive at 8:45 ----- Message -----    From: Weston Anna    Sent: 12/01/2014   4:29 PM      To: Lestine Mount  Can you schedule please :-)  Thank you   ----- Message -----    From: Massie Maroon, CMA    Sent: 12/01/2014   4:14 PM      To: Weston Anna  Please schedule lexi for pt per Dr. Harl Bowie abnormal GXT. I will let pt know when scheduled. Pt is aware that we may not call her with date and time until Monday. Thanks  [employer]

## 2014-12-04 NOTE — Telephone Encounter (Signed)
LMTCB 12/04/14, mailed pt letter of instructions for lexi.

## 2014-12-05 NOTE — Telephone Encounter (Signed)
Pt returned call and verbally given instructions for lexi including date and time. Pt verbalized understanding

## 2014-12-08 ENCOUNTER — Encounter (HOSPITAL_COMMUNITY): Payer: Self-pay

## 2014-12-08 ENCOUNTER — Encounter (HOSPITAL_COMMUNITY)
Admission: RE | Admit: 2014-12-08 | Discharge: 2014-12-08 | Disposition: A | Discharge status: Home / Self Care | Payer: 59 | Source: Ambulatory Visit | Attending: Cardiology | Admitting: Cardiology

## 2014-12-08 ENCOUNTER — Ambulatory Visit (HOSPITAL_COMMUNITY): Payer: 59

## 2014-12-08 ENCOUNTER — Encounter (HOSPITAL_COMMUNITY): Payer: 59

## 2014-12-08 ENCOUNTER — Ambulatory Visit (HOSPITAL_COMMUNITY)
Admission: RE | Admit: 2014-12-08 | Discharge: 2014-12-08 | Disposition: A | Discharge status: Home / Self Care | Payer: 59 | Source: Ambulatory Visit | Attending: Cardiology | Admitting: Cardiology

## 2014-12-08 DIAGNOSIS — R9439 Abnormal result of other cardiovascular function study: Secondary | ICD-10-CM | POA: Diagnosis present

## 2014-12-08 DIAGNOSIS — R079 Chest pain, unspecified: Secondary | ICD-10-CM | POA: Insufficient documentation

## 2014-12-08 MED ORDER — SODIUM CHLORIDE 0.9 % IJ SOLN
INTRAMUSCULAR | Status: AC
Start: 2014-12-08 — End: 2014-12-08
  Administered 2014-12-08: 10 mL via INTRAVENOUS
  Filled 2014-12-08: qty 3

## 2014-12-08 MED ORDER — SODIUM CHLORIDE 0.9 % IJ SOLN
10.0000 mL | INTRAMUSCULAR | Status: DC | PRN
  Administered 2014-12-08: 10 mL via INTRAVENOUS
  Filled 2014-12-08: qty 10

## 2014-12-08 MED ORDER — REGADENOSON 0.4 MG/5ML IV SOLN
INTRAVENOUS | Status: AC
  Administered 2014-12-08: 0.4 mg via INTRAVENOUS
  Filled 2014-12-08: qty 5

## 2014-12-08 MED ORDER — TECHNETIUM TC 99M SESTAMIBI - CARDIOLITE
30.0000 | Freq: Once | INTRAVENOUS | Status: AC | PRN
  Administered 2014-12-08: 30 via INTRAVENOUS

## 2014-12-08 MED ORDER — TECHNETIUM TC 99M SESTAMIBI GENERIC - CARDIOLITE
10.0000 | Freq: Once | INTRAVENOUS | Status: AC | PRN
  Administered 2014-12-08: 10 via INTRAVENOUS

## 2014-12-08 MED ORDER — REGADENOSON 0.4 MG/5ML IV SOLN
0.4000 mg | Freq: Once | INTRAVENOUS | Status: AC | PRN
  Administered 2014-12-08: 0.4 mg via INTRAVENOUS

## 2014-12-08 NOTE — Progress Notes (Signed)
Stress Lab Nurses Notes - [place] 12/08/2014 Reason for doing test: Chest Pain & Abnormal GXT Type of test: Wille Glaser Nurse performing test: Gerrit Halls, RN Nuclear Medicine Tech: Redmond Baseman Echo Tech: Not Applicable MD performing test: Koneswaran/K.Purcell Nails NP Family MD: Manuella Ghazi Test explained and consent signed: Yes.   IV started: Saline lock flushed, No redness or edema and Saline lock started in radiology Symptoms: chest pressure & head discomfort  Treatment/Intervention: None Reason test stopped: protocol completed After recovery IV was: Discontinued via X-ray tech and No redness or edema Patient to return to Nuc. Med at : 11:30 Patient discharged: Home Patient's Condition upon discharge was: stable Comments: During test BP 180/109 & HR 127 .  Recovery BP 173/95 & HR 82 .  Symptoms resolved in recovery. Continues to" feel" Blood pressure to be high. Geanie Cooley T

## 2014-12-11 ENCOUNTER — Encounter: Payer: Self-pay | Admitting: *Deleted

## 2014-12-11 ENCOUNTER — Telehealth: Payer: Self-pay | Admitting: *Deleted

## 2014-12-11 ENCOUNTER — Other Ambulatory Visit: Payer: Self-pay | Admitting: Cardiology

## 2014-12-11 DIAGNOSIS — Z01818 Encounter for other preprocedural examination: Secondary | ICD-10-CM

## 2014-12-11 NOTE — Telephone Encounter (Signed)
-----   Message from Arnoldo Lenis, MD sent at 12/08/2014  4:48 PM EST ----- I spoke with patient and updated her on abnormal stress test. She needs a LHC with coronary angio, preferably early this upcoming week. Please arrange and update patient   Maureen Abts MD

## 2014-12-11 NOTE — Telephone Encounter (Signed)
Pt scheduled for [facility] 12/12/14 at 10:30 with Dr. Claiborne Billings. Pt made aware and given verbal instructions preparation. Pt verbalized understanding, will call with any further questions. Will have labs done today at [facility]. Orders in for labs.

## 2014-12-12 ENCOUNTER — Other Ambulatory Visit: Payer: Self-pay

## 2014-12-12 ENCOUNTER — Ambulatory Visit (HOSPITAL_COMMUNITY)
Admission: RE | Admit: 2014-12-12 | Discharge: 2014-12-13 | Disposition: A | Discharge status: Home / Self Care | Payer: 59 | Source: Ambulatory Visit | Attending: Cardiovascular Disease | Admitting: Cardiovascular Disease

## 2014-12-12 ENCOUNTER — Encounter (HOSPITAL_COMMUNITY)
Admission: RE | Disposition: A | Discharge status: Home / Self Care | Payer: 59 | Source: Ambulatory Visit | Attending: Cardiovascular Disease

## 2014-12-12 ENCOUNTER — Encounter (HOSPITAL_COMMUNITY): Payer: Self-pay | Admitting: General Practice

## 2014-12-12 DIAGNOSIS — R079 Chest pain, unspecified: Secondary | ICD-10-CM | POA: Diagnosis present

## 2014-12-12 DIAGNOSIS — I10 Essential (primary) hypertension: Secondary | ICD-10-CM | POA: Diagnosis not present

## 2014-12-12 DIAGNOSIS — F17210 Nicotine dependence, cigarettes, uncomplicated: Secondary | ICD-10-CM | POA: Diagnosis not present

## 2014-12-12 DIAGNOSIS — Z7982 Long term (current) use of aspirin: Secondary | ICD-10-CM | POA: Diagnosis not present

## 2014-12-12 DIAGNOSIS — E785 Hyperlipidemia, unspecified: Secondary | ICD-10-CM | POA: Insufficient documentation

## 2014-12-12 DIAGNOSIS — E039 Hypothyroidism, unspecified: Secondary | ICD-10-CM | POA: Diagnosis not present

## 2014-12-12 DIAGNOSIS — Z79899 Other long term (current) drug therapy: Secondary | ICD-10-CM | POA: Insufficient documentation

## 2014-12-12 DIAGNOSIS — I25110 Atherosclerotic heart disease of native coronary artery with unstable angina pectoris: Secondary | ICD-10-CM | POA: Diagnosis not present

## 2014-12-12 DIAGNOSIS — Z955 Presence of coronary angioplasty implant and graft: Secondary | ICD-10-CM

## 2014-12-12 DIAGNOSIS — E890 Postprocedural hypothyroidism: Secondary | ICD-10-CM | POA: Diagnosis present

## 2014-12-12 DIAGNOSIS — Z720 Tobacco use: Secondary | ICD-10-CM | POA: Diagnosis present

## 2014-12-12 DIAGNOSIS — I200 Unstable angina: Secondary | ICD-10-CM

## 2014-12-12 HISTORY — PX: CORONARY ANGIOPLASTY WITH STENT PLACEMENT: SHX49

## 2014-12-12 HISTORY — DX: Atherosclerotic heart disease of native coronary artery without angina pectoris: I25.10

## 2014-12-12 HISTORY — PX: LEFT HEART CATHETERIZATION WITH CORONARY ANGIOGRAM: SHX5451

## 2014-12-12 LAB — CBC WITH DIFFERENTIAL/PLATELET
BASOS ABS: 0 10*3/uL (ref 0.0–0.1)
Basophils Relative: 0 % (ref 0–1)
EOS ABS: 0.2 10*3/uL (ref 0.0–0.7)
Eosinophils Relative: 2 % (ref 0–5)
HCT: 43.5 % (ref 36.0–46.0)
Hemoglobin: 14.1 g/dL (ref 12.0–15.0)
Lymphocytes Relative: 29 % (ref 12–46)
Lymphs Abs: 3.2 10*3/uL (ref 0.7–4.0)
MCH: 28.8 pg (ref 26.0–34.0)
MCHC: 32.4 g/dL (ref 30.0–36.0)
MCV: 88.8 fL (ref 78.0–100.0)
MONO ABS: 0.6 10*3/uL (ref 0.1–1.0)
MPV: 9.5 fL (ref 8.6–12.4)
Monocytes Relative: 5 % (ref 3–12)
NEUTROS ABS: 7.2 10*3/uL (ref 1.7–7.7)
Neutrophils Relative %: 64 % (ref 43–77)
PLATELETS: 333 10*3/uL (ref 150–400)
RBC: 4.9 MIL/uL (ref 3.87–5.11)
RDW: 13.7 % (ref 11.5–15.5)
WBC: 11.2 10*3/uL — AB (ref 4.0–10.5)

## 2014-12-12 LAB — BASIC METABOLIC PANEL
ANION GAP: 10 (ref 5–15)
BUN: 15 mg/dL (ref 6–23)
BUN: 11 mg/dL (ref 6–23)
CALCIUM: 9.9 mg/dL (ref 8.4–10.5)
CHLORIDE: 102 meq/L (ref 96–112)
CO2: 26 meq/L (ref 19–32)
CO2: 24 mmol/L (ref 19–32)
Calcium: 9.9 mg/dL (ref 8.4–10.5)
Chloride: 105 mmol/L (ref 96–112)
Creat: 0.69 mg/dL (ref 0.50–1.10)
Creatinine, Ser: 0.68 mg/dL (ref 0.50–1.10)
GFR calc Af Amer: >90 mL/min (ref >90)
GFR calc non Af Amer: >90 mL/min (ref >90)
Glucose, Bld: 114 mg/dL — ABNORMAL HIGH (ref 70–99)
Glucose, Bld: 92 mg/dL (ref 70–99)
POTASSIUM: 3.9 mmol/L (ref 3.5–5.1)
Potassium: 3.9 mEq/L (ref 3.5–5.3)
SODIUM: 140 meq/L (ref 135–145)
SODIUM: 139 mmol/L (ref 135–145)

## 2014-12-12 LAB — APTT: aPTT: 30 seconds (ref 24–37)

## 2014-12-12 LAB — PROTIME-INR
INR: 1.07 (ref 0.00–1.49)
PROTHROMBIN TIME: 14.1 s (ref 11.6–15.2)

## 2014-12-12 LAB — POCT ACTIVATED CLOTTING TIME: Activated Clotting Time: 552 seconds

## 2014-12-12 SURGERY — LEFT HEART CATHETERIZATION WITH CORONARY ANGIOGRAM

## 2014-12-12 MED ORDER — SODIUM CHLORIDE 0.9 % IJ SOLN: 3.0000 mL | INTRAMUSCULAR | Status: DC | PRN

## 2014-12-12 MED ORDER — SODIUM CHLORIDE 0.9 % IJ SOLN: 3.0000 mL | Freq: Two times a day (BID) | INTRAMUSCULAR | Status: DC

## 2014-12-12 MED ORDER — ATORVASTATIN CALCIUM 80 MG PO TABS
80.0000 mg | ORAL_TABLET | Freq: Every day | ORAL | Status: DC
  Filled 2014-12-12 (×2): qty 1

## 2014-12-12 MED ORDER — VERAPAMIL HCL 2.5 MG/ML IV SOLN
INTRAVENOUS | Status: AC
  Filled 2014-12-12: qty 2

## 2014-12-12 MED ORDER — NITROGLYCERIN 1 MG/10 ML FOR IR/CATH LAB
INTRA_ARTERIAL | Status: AC
  Filled 2014-12-12: qty 10

## 2014-12-12 MED ORDER — FENTANYL CITRATE 0.05 MG/ML IJ SOLN
INTRAMUSCULAR | Status: AC
  Filled 2014-12-12: qty 2

## 2014-12-12 MED ORDER — HEPARIN (PORCINE) IN NACL 2-0.9 UNIT/ML-% IJ SOLN
INTRAMUSCULAR | Status: AC
  Filled 2014-12-12: qty 1500

## 2014-12-12 MED ORDER — SODIUM CHLORIDE 0.9 % IV SOLN: 250.0000 mL | INTRAVENOUS | Status: DC | PRN

## 2014-12-12 MED ORDER — TICAGRELOR 90 MG PO TABS
ORAL_TABLET | ORAL | Status: AC
  Filled 2014-12-12: qty 2

## 2014-12-12 MED ORDER — ONDANSETRON HCL 4 MG/2ML IJ SOLN: 4.0000 mg | Freq: Four times a day (QID) | INTRAMUSCULAR | Status: DC | PRN

## 2014-12-12 MED ORDER — LIDOCAINE HCL (PF) 1 % IJ SOLN
INTRAMUSCULAR | Status: AC
  Filled 2014-12-12: qty 30

## 2014-12-12 MED ORDER — MIDAZOLAM HCL 2 MG/2ML IJ SOLN
INTRAMUSCULAR | Status: AC
  Filled 2014-12-12: qty 2

## 2014-12-12 MED ORDER — ACETAMINOPHEN 325 MG PO TABS
650.0000 mg | ORAL_TABLET | ORAL | Status: DC | PRN
  Administered 2014-12-12 – 2014-12-13 (×2): 650 mg via ORAL
  Filled 2014-12-12 (×2): qty 2

## 2014-12-12 MED ORDER — SODIUM CHLORIDE 0.9 % IV SOLN
INTRAVENOUS | Status: DC
  Administered 2014-12-12: 10:00:00 via INTRAVENOUS

## 2014-12-12 MED ORDER — MORPHINE SULFATE 2 MG/ML IJ SOLN
2.0000 mg | INTRAMUSCULAR | Status: DC | PRN
  Filled 2014-12-12: qty 1

## 2014-12-12 MED ORDER — SODIUM CHLORIDE 0.9 % IV SOLN
0.2500 mg/kg/h | INTRAVENOUS | Status: AC
  Administered 2014-12-12: 0.25 mg/kg/h via INTRAVENOUS
  Filled 2014-12-12: qty 250

## 2014-12-12 MED ORDER — BIVALIRUDIN 250 MG IV SOLR
INTRAVENOUS | Status: AC
  Filled 2014-12-12: qty 250

## 2014-12-12 MED ORDER — HEPARIN SODIUM (PORCINE) 1000 UNIT/ML IJ SOLN
INTRAMUSCULAR | Status: AC
  Filled 2014-12-12: qty 1

## 2014-12-12 MED ORDER — TICAGRELOR 90 MG PO TABS
90.0000 mg | ORAL_TABLET | Freq: Two times a day (BID) | ORAL | Status: DC
  Administered 2014-12-12 – 2014-12-13 (×2): 90 mg via ORAL
  Filled 2014-12-12 (×3): qty 1

## 2014-12-12 MED ORDER — ASPIRIN 81 MG PO CHEW: 81.0000 mg | CHEWABLE_TABLET | ORAL | Status: DC

## 2014-12-12 MED ORDER — SODIUM CHLORIDE 0.9 % IV SOLN
INTRAVENOUS | Status: DC
  Administered 2014-12-12: 17:00:00 via INTRAVENOUS

## 2014-12-12 MED ORDER — ASPIRIN EC 81 MG PO TBEC
81.0000 mg | DELAYED_RELEASE_TABLET | Freq: Every day | ORAL | Status: DC
  Administered 2014-12-13: 11:00:00 81 mg via ORAL
  Filled 2014-12-12: qty 1

## 2014-12-12 NOTE — H&P (View-Only) (Signed)
Clinical Summary Maureen Martin is a 54 y.o.female seen today for follow up of the following medical problems.   1. Chest pain - admit to [facility] 04/2014 with chest pain - trop neg x 4, EKG non-specific changes - echo 04/27/14 showed LVEF 75-10%, grade I diastolic dysfunction, no WMAs CAD risk factors: HTN, HL, + tobacco, father MI 10s, maternal grandmother unsure of age.   - at our last visit symptoms had resolved after stopping goodies powder and starting ppi, suspected GI source.  - since then she reports recurrence of symptoms. Dull pain left chest, 6/10. Tends to occur with exertion. No other associated symptoms. Lasts 2 minutes. Not positional. Occurs every 2-3 days. Denies any DOE.   Past Medical History  Diagnosis Date  . Hyperthyroidism   . Hypothyroidism, postradioiodine therapy   . Tobacco abuse   . Hyperlipidemia 04/27/2014  . HTN (hypertension) 04/27/2014     Allergies  Allergen Reactions  . Prednisone Other (See Comments)    Made aggressive.     Current Outpatient Prescriptions  Medication Sig Dispense Refill  . acetaminophen (TYLENOL) 500 MG tablet Take 1-2 tablets (500-1,000 mg total) by mouth every 6 (six) hours as needed.    Marland Kitchen aspirin EC 81 MG tablet Take 1 tablet (81 mg total) by mouth daily.    . hydrochlorothiazide (MICROZIDE) 12.5 MG capsule Take 1 capsule (12.5 mg total) by mouth daily. 30 capsule 3  . levothyroxine (SYNTHROID, LEVOTHROID) 50 MCG tablet Take 50 mcg by mouth daily.    . pantoprazole (PROTONIX) 40 MG tablet Take 1 tablet (40 mg total) by mouth daily. 30 tablet 3  . simvastatin (ZOCOR) 20 MG tablet Take 1 tablet (20 mg total) by mouth daily. 30 tablet 3   No current facility-administered medications for this visit.     Past Surgical History  Procedure Laterality Date  . Cesarean section      x 2  . Tonsillectomy       Allergies  Allergen Reactions  . Prednisone Other (See Comments)    Made aggressive.      Family  History  Problem Relation Age of Onset  . Coronary artery disease Father   . Valvular heart disease Father      Social History Maureen Martin reports that she has been smoking Cigarettes.  She started smoking about 21 years ago. She has a 15 pack-year smoking history. She has never used smokeless tobacco. Maureen Martin reports that she does not drink alcohol.   Review of Systems CONSTITUTIONAL: No weight loss, fever, chills, weakness or fatigue.  HEENT: Eyes: No visual loss, blurred vision, double vision or yellow sclerae.No hearing loss, sneezing, congestion, runny nose or sore throat.  SKIN: No rash or itching.  CARDIOVASCULAR: per HPI RESPIRATORY: No shortness of breath, cough or sputum.  GASTROINTESTINAL: No anorexia, nausea, vomiting or diarrhea. No abdominal pain or blood.  GENITOURINARY: No burning on urination, no polyuria NEUROLOGICAL: No headache, dizziness, syncope, paralysis, ataxia, numbness or tingling in the extremities. No change in bowel or bladder control.  MUSCULOSKELETAL: No muscle, back pain, joint pain or stiffness.  LYMPHATICS: No enlarged nodes. No history of splenectomy.  PSYCHIATRIC: No history of depression or anxiety.  ENDOCRINOLOGIC: No reports of sweating, cold or heat intolerance. No polyuria or polydipsia.  Marland Kitchen   Physical Examination p 86 bp 138/82 Wt 194 lbs BMI 31 Gen: resting comfortably, no acute distress HEENT: no scleral icterus, pupils equal round and reactive, no palptable cervical adenopathy,  CV: RRR, no m/r/g, no JVD, no carotid bruits Resp: Clear to auscultation bilaterally GI: abdomen is soft, non-tender, non-distended, normal bowel sounds, no hepatosplenomegaly MSK: extremities are warm, no edema.  Skin: warm, no rash Neuro:  no focal deficits Psych: appropriate affect   Diagnostic Studies 04/2014 Echo Study Conclusions  - Procedure narrative: Transthoracic echocardiography. Image quality was suboptimal, due to poor sound  transmission. - Left ventricle: The cavity size was normal. Wall thickness was increased in a pattern of mild LVH. Systolic function was normal. The estimated ejection fraction was in the range of 60% to 65%. Wall motion was normal; there were no regional wall motion abnormalities. Doppler parameters are consistent with abnormal left ventricular relaxation (grade 1 diastolic dysfunction).    Assessment and Plan  1. Chest pain - initially resolved after stopping goodies powder and starting ppi, however have returned - exact etiology remains unclear, she does have several CAD risk factors - will refer for GXT to further evaluate possible cardiac cause. Given prescription for prn SL NG.  - she has asked that we also check her routine labs, will order   F/u pending stress test results      Arnoldo Lenis, M.D.

## 2014-12-12 NOTE — Progress Notes (Signed)
TR BAND REMOVAL  LOCATION:    right radial  DEFLATED PER PROTOCOL:    Yes.    TIME BAND OFF / DRESSING APPLIED:    20:00   SITE UPON ARRIVAL:    Level 0  SITE AFTER BAND REMOVAL:    Level 0  REVERSE ALLEN'S TEST:     positive  CIRCULATION SENSATION AND MOVEMENT:    Within Normal Limits   Yes.    COMMENTS:   Pt tolerated removal of TR band without complications, will continue to monitor patient.

## 2014-12-12 NOTE — CV Procedure (Signed)
Maureen Martin is a 54 y.o. female   [id number]  [id number] LOCATION:  FACILITY: [place]  PHYSICIAN: Troy Sine, MD, [facility] 1961/02/02   DATE OF PROCEDURE:  12/12/2014     CARDIAC CATHETERIZATION/PERCUTANEOUS CORONARY INTERVENTION    HISTORY:   Ms. Audrionna Lampton is a 54 year old female who is referred by Dr. Roderic Palau branch for cardiac catheterization and possible percutaneous coronary intervention.  She had been admitted to [facility] last summer with chest pain.  Recently she develop recurrent episodes of chest pain.  Her cardiac risk factors include hypertension, hyperlipidemia, tobacco use, and the father suffering a myocardial infarction in his 8s.  A nuclear study was recently performed and was interpreted as high risk with significant ischemia.    PROCEDURE: Left heart catheterization via the right radial artery approach: Coronary angiography, left ventriculography; 2 vessel percutaneous coronary intervention with PTCA/DES stenting of a proximal, mid, and mid-distal RCA stenosis with insertion of tandem DES stents, and PTCA/DES stenting of the circumflex vessel with insertion of a DES stent into the proximal circumflex extending into the OM1 vessel.  The patient was brought to the second floor [place] [facility]iac cath lab in the fasting state. The patient was premedicated with Versed 2 mg and fentanyl 50 mcg. A right radial approach was utilized after an Allen's test verified adequate circulation. The right radial artery was punctured via the Seldinger technique, and a 6 [place] Glidesheath Slender was inserted without difficulty.  A radial cocktail consisting of Verapamil, IV nitroglycerin, and lidocaine was administered. Weight adjusted heparin was administered. A safety J wire was advanced into the ascending aorta. Diagnostic catheterization was done with a 5 [place] TIG 4.0 catheter. A 5 French pigtail catheter was used for left ventriculography.  With the demonstration of  high-grade subtotal stenoses in both the RCA and circumflex vessel.  After reviewing the angiographic findings with the patient, decision was made to proceed with percutaneous coronary intervention.  Angiomax bolus plus infusion was administered.  Brilinta 180 mg was administered orally.  Attention was first directed at [facility] which had a subtotal proximal stenosis with focal thrombus, a 70% mid stenosis and 90% mid-distal stenosis just proximal to the acute margin.  A 6 [place] guide was used.  The Prowater wire was advanced down into the distal RCA PDA vessel.  Predilatation was done at all 3 sites with a Euphora 2.512 mm balloon.  A Resolute integrity 3.538 mm DES stent was then inserted and advanced to just beyond the distal lesion.  This was dilated 2 up to 13 atm.   A Resolute integrity DES 3.515 mm stent was  inserted in tandem proximal to the previously placed stent to cover the most proximal stenosis.  This was dilated 2 up to 13 atm.  An [place] Euphora 3.75 27 mm balloon was used for post stent dilatation and was dilated at all sites in the tandem stented segment up to 3.75 mm proximally and 3.66 mm distally.  Angiography confirmed an excellent result.  Since the patient tolerated the 3 lesion intervention to the RCA, successfully and had a subtotal circumflex marginal stenosis.  The decision was made to perform intervention to the subtotal circumflex vessel.  Presently.  Initially, a 6 [place] XB 3.5 guide was inserted but this was unable to selectively cannulate the left main.  Ultimately, a 6 [place] EBU 3.5 guiding catheter was inserted and this was successfully able to intubate the left main.  The same Prowater was advanced.  He  circumflex vessel and was able to cross the subtotal stenosis at the origin of the OM1 vessel.  The previously placed you for a 2.512 mm balloon was used for predilatation.  A Resolute integrity DES 3.018 mm stent was inserted and was deployed in the proximal circumflex  extending into the OM1 vessel with 2 inflations at 12 and 13 atm. An  [place] Euphora 3.2512 mm balloon was used for post stent dilatation.  Angiography confirmed an excellent result.  Poor the procedure, she received several doses of intracoronary nitroglycerin.  She also received additional Versed and fentanyl. . A TR radial band was applied for hemostasis.  She left the catheterization laboratory chest pain-free with stable hemodynamics.    HEMODYNAMICS:   Central Aorta: 120/65  Left Ventricle: 120/19  ANGIOGRAPHY:   The left main coronary artery was angiographically normal and bifurcated into the LAD and left circumflex coronary artery.   The LAD was a moderate size vessel that gave rise to  3 diagonal vessels and several septal perforating arteries.  There was ignificant collateralization to several vessels of the distal RCA via septal perforators as well as via the apical portion of the LAD.  The left circumflex coronary artery was a moderate size vessel that had smooth 20% ostial narrowing.  After a very small proximal branch, flex gave rise to a large OM vessel.  At the ostium of this OM vessel.  There was a 99% stenosis.  There was some collateralization to the distal RCA via the distal AV groove circumflex vessel.   The RCA was a large caliber dominant vessel which gave rise to a large PDA and PLA vessel.  There was a 99% stenosis in the proximal bend of the RCA.  Immediately beyond this was an area of haziness due to thrombus.  There was a 60-70% smooth mid stenosis before the anterior RV marginal branch.  There was a 90% stenosis immediately proximal to the acute margin.  The PDA had diffuse smooth 50% stenoses.  There was antegrade filling down the PDA and PLA vessel which also had left to right collateral filling.  Left ventriculography revealed hyperdynamic global LV contractility without focal segmental wall motion abnormalities. There was no evidence for mitral regurgitation.   Ejection fraction was 65%.  Following percutaneous coronary intervention with PTCA/DES stenting of the three proximal, mid and mid-distal RCA lesions and insertion of tandem 3.538 mm distally and 3.515 mm Resolute DES stents proximally, the entire proximal to acute margin region was reduced to 0%.  There was brisk TIMI-3 flow.  There was no evidence for dissection.  Repeat angiography in the left coronary system after successful RCA stenting no longer showed the previous significant collateralization left-to-right since now the RCA antegrade flow was excellent.  Following PTCA/stenting of the circumflex vessel with insertion of a 3.018 mm Resolute integrity DES stent postdilated to 3.25 mm extending from the proximal circumflex into the OM1 vessel, the entire region was reduced to 0%.  There was no evidence for dissection.    IMPRESSION:  Normal left ventricular function with an ejection fraction of 65% without wall motion abnormalities.  Significant 2 vessel coronary obstructive disease with normal left main and LAD; 20% smooth ostial narrowing of the circumflex with 99% circumflex marginal stenosis with initial significant 3 site collateralization to the distal RCA, both via the LAD and circumflex vessel; and 99% proximal RCA stenosis with thrombus, 60-70% mid RCA stenosis and 90% RCA stenosis just proximal to the acute margin and 50%  smooth PDA stenoses.  Successful 2 vessel percutaneous coronary intervention with PTCA/stenting of 3 sites in the proximal, mid and mid-distal RCA with ultimate insertion of tandem 3.538 and 3.515 mm Resolute DES stents with the stenoses being reduced to 0%, and successful PTCA/stenting of the circumflex/OM1 vessel with a 99% stenosis being reduced to 0%  With insertion of a 3.018 mm Resolute DES stent   RECOMMENDATION:  Patient will be maintained on dual antiplatelet therapy for a minimum of a year and probably longer.  She will be treated with aggressive  statin therapy, beta blocker, ACE inhibitor.   Smoking cessation is imperative.   Troy Sine, MD, [facility] 12/12/2014 2:17 PM

## 2014-12-12 NOTE — Care Management Note (Signed)
    Page 1 of 1   12/12/2014     4:33:12 PM CARE MANAGEMENT NOTE 12/12/2014  Patient:  IYA, HAMED   Account Number:  [id number]  Date Initiated:  12/12/2014  Documentation initiated by:  Alexus Galka  Subjective/Objective Assessment:   Pt s/p PCI on 2/9.     Action/Plan:   Pt to dc on Brilinta therapy.   Anticipated DC Date:  12/13/2014   Anticipated DC Plan:  [place]  CM consult  Medication Assistance      Choice offered to / List presented to:             Status of service:  Completed, signed off Medicare Important Message given?  NO (If response is "NO", the following Medicare IM given date fields will be blank) Date Medicare IM given:   Medicare IM given by:   Date Additional Medicare IM given:   Additional Medicare IM given by:    Discharge Disposition:  HOME/SELF CARE  Per UR Regulation:  Reviewed for med. necessity/level of care/duration of stay  If discussed at [place] of Stay Meetings, dates discussed:    Comments:  12/12/14 Ellan Lambert, RN, BSN [phone]  Will give Brilinta booklet with free trial and copay cards. Will check copay amt, though pt has commercial insurance and can use copay assist card to bring down to $18/month.

## 2014-12-12 NOTE — Interval H&P Note (Signed)
Cath Lab Visit (complete for each Cath Lab visit)  Clinical Evaluation Leading to the Procedure:   ACS: No.  Non-ACS:    Anginal Classification: CCS III  Anti-ischemic medical therapy: Minimal Therapy (1 class of medications)  Non-Invasive Test Results: High-risk stress test findings: cardiac mortality >3%/year  Prior CABG: No previous CABG      History and Physical Interval Note:  12/12/2014 11:09 AM  Maureen Martin  has presented today for surgery, with the diagnosis of abnormal stress test  The various methods of treatment have been discussed with the patient and family. After consideration of risks, benefits and other options for treatment, the patient has consented to  Procedure(s): LEFT HEART CATHETERIZATION WITH CORONARY ANGIOGRAM (N/A) as a surgical intervention .  The patient's history has been reviewed, patient examined, no change in status, stable for surgery.  I have reviewed the patient's chart and labs.  Questions were answered to the patient's satisfaction.     Loyd Marhefka A

## 2014-12-13 ENCOUNTER — Encounter: Payer: Self-pay | Admitting: Physician Assistant

## 2014-12-13 ENCOUNTER — Telehealth: Payer: Self-pay | Admitting: *Deleted

## 2014-12-13 DIAGNOSIS — E039 Hypothyroidism, unspecified: Secondary | ICD-10-CM | POA: Diagnosis not present

## 2014-12-13 DIAGNOSIS — I25110 Atherosclerotic heart disease of native coronary artery with unstable angina pectoris: Secondary | ICD-10-CM | POA: Diagnosis not present

## 2014-12-13 DIAGNOSIS — Z720 Tobacco use: Secondary | ICD-10-CM

## 2014-12-13 DIAGNOSIS — I10 Essential (primary) hypertension: Secondary | ICD-10-CM | POA: Diagnosis not present

## 2014-12-13 DIAGNOSIS — E785 Hyperlipidemia, unspecified: Secondary | ICD-10-CM | POA: Diagnosis not present

## 2014-12-13 LAB — BASIC METABOLIC PANEL
Anion gap: 5 (ref 5–15)
BUN: 9 mg/dL (ref 6–23)
CO2: 26 mmol/L (ref 19–32)
CREATININE: 0.74 mg/dL (ref 0.50–1.10)
Calcium: 8.7 mg/dL (ref 8.4–10.5)
Chloride: 108 mmol/L (ref 96–112)
Glucose, Bld: 95 mg/dL (ref 70–99)
Potassium: 3.7 mmol/L (ref 3.5–5.1)
Sodium: 139 mmol/L (ref 135–145)

## 2014-12-13 LAB — CBC
HCT: 36.2 % (ref 36.0–46.0)
Hemoglobin: 11.7 g/dL — ABNORMAL LOW (ref 12.0–15.0)
MCH: 28.8 pg (ref 26.0–34.0)
MCHC: 32.3 g/dL (ref 30.0–36.0)
MCV: 89.2 fL (ref 78.0–100.0)
PLATELETS: 248 10*3/uL (ref 150–400)
RBC: 4.06 MIL/uL (ref 3.87–5.11)
RDW: 12.9 % (ref 11.5–15.5)
WBC: 10.1 10*3/uL (ref 4.0–10.5)

## 2014-12-13 MED ORDER — BUPROPION HCL ER (SMOKING DET) 150 MG PO TB12: ORAL_TABLET | ORAL | Status: DC

## 2014-12-13 MED ORDER — TICAGRELOR 90 MG PO TABS: 90.0000 mg | ORAL_TABLET | Freq: Two times a day (BID) | ORAL | Status: DC

## 2014-12-13 MED ORDER — SIMVASTATIN 40 MG PO TABS
40.0000 mg | ORAL_TABLET | Freq: Every day | ORAL | Status: DC
  Filled 2014-12-13: qty 1

## 2014-12-13 MED ORDER — METOPROLOL TARTRATE 25 MG PO TABS: 12.5000 mg | ORAL_TABLET | Freq: Two times a day (BID) | ORAL | Status: DC

## 2014-12-13 MED ORDER — SIMVASTATIN 40 MG PO TABS: 40.0000 mg | ORAL_TABLET | Freq: Every day | ORAL | Status: DC

## 2014-12-13 MED FILL — Sodium Chloride IV Soln 0.9%: INTRAVENOUS | Qty: 50 | Status: AC

## 2014-12-13 NOTE — Progress Notes (Signed)
Patient Name: Maureen Martin Date of Encounter: 12/13/2014  Dr. Harl Bowie   Active Problems:   Unstable angina    SUBJECTIVE  Denies any CP or SOB.   CURRENT MEDS . aspirin EC  81 mg Oral Daily  . atorvastatin  80 mg Oral q1800  . ticagrelor  90 mg Oral BID    OBJECTIVE  Filed Vitals:   12/12/14 2200 12/12/14 2230 12/13/14 0013 12/13/14 0609  BP: 106/43 106/46  97/47  Pulse:  79  61  Temp:  99.7 F (37.6 C)  98.4 F (36.9 C)  TempSrc:  Oral  Oral  Resp:  18  20  Height:      Weight:   196 lb 13.9 oz (89.3 kg)   SpO2:  97%  94%    Intake/Output Summary (Last 24 hours) at 12/13/14 0658 Last data filed at 12/13/14 0200  Gross per 24 hour  Intake 2361.67 ml  Output    700 ml  Net 1661.67 ml   Filed Weights   12/12/14 0907 12/13/14 0013  Weight: 196 lb (88.905 kg) 196 lb 13.9 oz (89.3 kg)    PHYSICAL EXAM  General: Pleasant, NAD. Neuro: Alert and oriented X 3. Moves all extremities spontaneously. Psych: Normal affect. HEENT:  Normal  Neck: Supple without bruits or JVD. Lungs:  Resp regular and unlabored, CTA. Heart: RRR no s3, s4, or murmurs. R radial cath site stable.  Abdomen: Soft, non-tender, non-distended, BS + x 4.  Extremities: No clubbing, cyanosis or edema. DP/PT/Radials 2+ and equal bilaterally.  Accessory Clinical Findings  CBC  Recent Labs  12/11/14 1649 12/13/14 0411  WBC 11.2* 10.1  NEUTROABS 7.2  --   HGB 14.1 11.7*  HCT 43.5 36.2  MCV 88.8 89.2  PLT 333 062   Basic Metabolic Panel  Recent Labs  12/12/14 0945 12/13/14 0411  NA 139 139  K 3.9 3.7  CL 105 108  CO2 24 26  GLUCOSE 92 95  BUN 11 9  CREATININE 0.68 0.74  CALCIUM 9.9 8.7    TELE NSR with HR 50-60s    ECG  NSR without significant ST-T wave changes  Echocardiogram 04/27/2014  LV EF: 60% -  65%  ------------------------------------------------------------------- Indications:   Chest pain  786.51.  ------------------------------------------------------------------- History:  PMH: Elevated Blood Pressure. Angina pectoris. Risk factors: Current tobacco use. Dyslipidemia.  ------------------------------------------------------------------- Study Conclusions  - Procedure narrative: Transthoracic echocardiography. Image quality was suboptimal, due to poor sound transmission. - Left ventricle: The cavity size was normal. Wall thickness was increased in a pattern of mild LVH. Systolic function was normal. The estimated ejection fraction was in the range of 60% to 65%. Wall motion was normal; there were no regional wall motion abnormalities. Doppler parameters are consistent with abnormal left ventricular relaxation (grade 1 diastolic dysfunction).     Radiology/Studies  Nm Myocar Multi W/spect W/wall Motion / Ef  12/08/2014   CLINICAL DATA:  54 year old woman with hypertension, hyperlipidemia, and tobacco use who has been experiencing chest pain and is referred for an ischemic evaluation.  EXAM: MYOCARDIAL IMAGING WITH SPECT (REST AND PHARMACOLOGIC-STRESS)  GATED LEFT VENTRICULAR WALL MOTION STUDY  LEFT VENTRICULAR EJECTION FRACTION  TECHNIQUE: Standard myocardial SPECT imaging was performed after resting intravenous injection of 10 mCi Tc-30m sestamibi. Subsequently, intravenous infusion of Lexiscan was performed under the supervision of the Cardiology staff. At peak effect of the drug, 30 mCi Tc-48m sestamibi was injected intravenously and standard myocardial SPECT imaging was performed. Quantitative gated imaging was  also performed to evaluate left ventricular wall motion, and estimate left ventricular ejection fraction.  COMPARISON:  None.  FINDINGS: Stress data: The patient was stressed according to the Lexiscan protocol. The heart rate ranged from 63 up to 131 beats per min. The blood pressure ranged from 136/92 up to 180/109. No chest pain was reported.  The  resting ECG showed normal sinus rhythm with a mild nonspecific T wave abnormality in the inferior leads. With stress, there were 1.5 mm downsloping ST segment depressions in V2 persisting into recovery. Isolated PVC in recovery.  Perfusion: There is a large area of severely decreased perfusion in the lateral and inferolateral walls extending from the apex to the base with partial reversibility, suggestive of a severe degree of ischemia.  Wall Motion: Severe lateral wall hypokinesis to akinesis. No left ventricular dilation.  Left Ventricular Ejection Fraction: 68 %  End diastolic volume 79 ml  End systolic volume 25 ml  IMPRESSION: 1. Large degree of ischemia in the lateral and inferolateral walls extending from the apex to the base.  2. Severe lateral wall hypokinesis to akinesis.  3. Left ventricular ejection fraction 68%  4.  High-risk stress test findings*.  *2012 Appropriate Use Criteria for Coronary Revascularization Focused Update: J Am Coll Cardiol. 9242;68(3):419-622. http://content.airportbarriers.com.aspx?articleid=1201161   Electronically Signed   By: Kate Sable   On: 12/08/2014 15:30    ASSESSMENT AND PLAN  1. Chest pain  - s/p high risk myoview 12/08/2014 with large area of ischemia.  - cath 12/12/2014 EF 65%, 99% prox RCA s/p DESx2, 60-70% mid RCA, 99% LCx marginal s/p DES  - continue ASA and Brilinta. Low BP last night. Home HCTZ stopped. Unable to add BB at this time.  - states she is unable to tolerate lipitor due to side effect, however able to tolerate Simvastatin at home, she takes 20mg  at home, consider replace lipitor with 40mg  simvastatin.    - repeated emphasis has been placed on DAPT compliance. Consider add BB on followup once BP come up, otherwise stable for discharge.  2. CAD 3. HTN  4.HLD 5. Tobacco abuse: patient interested in Wellbutrin which has worked for her in the past, will need to see PCP for Rx 6. Hypothyroidism  Signed, Almyra Deforest PA-C Pager:  [phone]   Patient seen and examined. Agree with assessment and plan. Severe 2 vessel CAD noted yesterday at cath, with 3 lesion PCI to RCA and PCI to subtotal LCX. Pt feels significantly better. No recurrent chest pain.  Consider adding low dose BB and ACE-I as outpatient if BP allows.  Will increase simvastatin to 40 mg and if LDL not at target then change to Vytorin10/40.  OK to prescribe welbutrin to aid for smoking cessation as DC today.   Troy Sine, MD, [facility] 12/13/2014 9:36 AM

## 2014-12-13 NOTE — Discharge Summary (Signed)
Discharge Summary   Patient ID: Maureen Martin,  MRN: [id number], DOB/AGE: 1961-06-15 54 y.o.  Admit date: 12/12/2014 Discharge date: 12/13/2014  Primary Care Provider: [facility] Primary Cardiologist: Dr. Harl Bowie  Discharge Diagnoses Principal Problem:   Unstable angina Active Problems:   Hypothyroidism, postradioiodine therapy   Tobacco abuse   Hyperlipidemia   HTN (hypertension)   Allergies Allergies  Allergen Reactions  . Prednisone Other (See Comments)    Made aggressive.    Procedures  Cardiac catheterization 12/12/2014 IMPRESSION:  Normal left ventricular function with an ejection fraction of 65% without wall motion abnormalities.  Significant 2 vessel coronary obstructive disease with normal left main and LAD; 20% smooth ostial narrowing of the circumflex with 99% circumflex marginal stenosis with initial significant 3 site collateralization to the distal RCA, both via the LAD and circumflex vessel; and 99% proximal RCA stenosis with thrombus, 60-70% mid RCA stenosis and 90% RCA stenosis just proximal to the acute margin and 50% smooth PDA stenoses.  Successful 2 vessel percutaneous coronary intervention with PTCA/stenting of 3 sites in the proximal, mid and mid-distal RCA with ultimate insertion of tandem 3.538 and 3.515 mm Resolute DES stents with the stenoses being reduced to 0%, and successful PTCA/stenting of the circumflex/OM1 vessel with a 99% stenosis being reduced to 0% With insertion of a 3.018 mm Resolute DES stent   RECOMMENDATION:  Patient will be maintained on dual antiplatelet therapy for a minimum of a year and probably longer. She will be treated with aggressive statin therapy, beta blocker, ACE inhibitor. Smoking cessation is imperative.      Hospital Course  The patient is a 53 year old female who was recently seen by her cardiologist during which visit, she complained of some chest discomfort. She was last admitted to [facility] in June 2015 with chest pain. Serial troponin was negative at the time. Echocardiogram obtained on 04/27/2014 showed EF 85-88%, grade 1 diastolic dysfunction, no regional wall motion abnormality. She has significant cardiac risk factors include hypertension, hyperlipidemia, positive tobacco abuse and a family history of CAD. During the last office visit, she complained of recurrent chest discomfort worse with exertion. There is option was discussed with the patient, eventually was decided for the patient to undergo stress test. She underwent Lexis scan stress test on 12/08/2014 which showed EF 68%, severe lateral wall hypokinesis to akinesis, large degree of ischemia in the lateral and inferolateral wall extending from apex to the base. Given the high risk stress test finding, cardiac catheterization was recommended.  She underwent planned cardiac catheterization on 12/12/2014 which showed EF 65%, and 99% proximal RCA treated with DES 2, 60-70% mid RCA, 99% left circumflex marginal status post DES 1. Post cath, patient was placed on aspirin and Brilinta. She was also placed on Lipitor as well, however she refused to take Lipitor due to her previous myalgia associated with Lipitor. We have changed her to simvastatin at a higher dose. Patient is also interested in quitting tobacco. Wellbutrin has worked for her in the past, after discussing with Dr. Claiborne Billings, we will start her on Welbutrin  She was seen the morning of 12/13/2014 and, at which time she denies any significant chest discomfort or shortness breath. She is deemed stable for discharge from cardiology perspective. Her right radial cath site appears to be stable without significant bleeding. She does have some degree of hypotension overnight with blood pressure in the 80s, however denies any significant discomfort or back pain. It is likely that her hypotension is  sedation related. After discussing with Dr. Claiborne Billings, we will give her a Rx for low dose  metoprolol and instruct her to start when SBP >110. Follow-up has been arranged with Dr. Harl Bowie.   Discharge Vitals Blood pressure 119/58, pulse 60, temperature 98.5 F (36.9 C), temperature source Oral, resp. rate 20, height 5\' 6"  (1.676 m), weight 196 lb 13.9 oz (89.3 kg), SpO2 97 %.  Filed Weights   12/12/14 0907 12/13/14 0013  Weight: 196 lb (88.905 kg) 196 lb 13.9 oz (89.3 kg)    Labs  CBC  Recent Labs  12/11/14 1649 12/13/14 0411  WBC 11.2* 10.1  NEUTROABS 7.2  --   HGB 14.1 11.7*  HCT 43.5 36.2  MCV 88.8 89.2  PLT 333 403   Basic Metabolic Panel  Recent Labs  12/12/14 0945 12/13/14 0411  NA 139 139  K 3.9 3.7  CL 105 108  CO2 24 26  GLUCOSE 92 95  BUN 11 9  CREATININE 0.68 0.74  CALCIUM 9.9 8.7    Disposition  Pt is being discharged home today in good condition.  Follow-up Plans & Appointments      Follow-up Information    Follow up with Arnoldo Lenis, MD On 01/02/2015.   Specialty:  Cardiology   Why:  3:40pm   Contact information:   [place] [address] [phone]       Discharge Medications    Medication List    STOP taking these medications        hydrochlorothiazide 12.5 MG capsule  Commonly known as:  MICROZIDE      TAKE these medications        acetaminophen 500 MG tablet  Commonly known as:  TYLENOL  Take 1-2 tablets (500-1,000 mg total) by mouth every 6 (six) hours as needed.     aspirin EC 81 MG tablet  Take 1 tablet (81 mg total) by mouth daily.     buPROPion 150 MG 12 hr tablet  Commonly known as:  ZYBAN  Take 150mg  daily for 3 days, then transition to 150mg  twice a day for 7-12 weeks until quit smoking     HAIR/SKIN/NAILS PO  Take 1 tablet by mouth 3 (three) times daily.     levothyroxine 50 MCG tablet  Commonly known as:  SYNTHROID, LEVOTHROID  Take 50 mcg by mouth daily.     metoprolol tartrate 25 MG tablet  Commonly known as:  LOPRESSOR  Take 0.5 tablets (12.5 mg total)  by mouth 2 (two) times daily. Start medication when SBP > 110     nitroGLYCERIN 0.4 MG SL tablet  Commonly known as:  NITROSTAT  Place 1 tablet (0.4 mg total) under the tongue every 5 (five) minutes as needed for chest pain.     OVER THE COUNTER MEDICATION  Take 1 tablet by mouth daily. True Heart     pantoprazole 40 MG tablet  Commonly known as:  PROTONIX  Take 1 tablet (40 mg total) by mouth daily.     simvastatin 40 MG tablet  Commonly known as:  ZOCOR  Take 1 tablet (40 mg total) by mouth daily at 6 PM.     ticagrelor 90 MG Tabs tablet  Commonly known as:  BRILINTA  Take 1 tablet (90 mg total) by mouth 2 (two) times daily.         Duration of Discharge Encounter   Greater than 30 minutes including physician time.  Signed, Almyra Deforest PA-C Pager:  [phone] 12/13/2014, 10:22 AM

## 2014-12-13 NOTE — Telephone Encounter (Signed)
Pt stated pharmacy was only able to give 3 pills of Brillinta until new shipment came in, pt requesting samples, will leave samples available for pickup tomorrow. 2 bottles [id number] lot exp 5-18

## 2014-12-13 NOTE — Discharge Instructions (Signed)
Coronary Angiogram With Stent, Care After Refer to this sheet in the next few weeks. These instructions provide you with information on caring for yourself after your procedure. Your health care provider may also give you more specific instructions. Your treatment has been planned according to current medical practices, but problems sometimes occur. Call your health care provider if you have any problems or questions after your procedure.  WHAT TO EXPECT AFTER THE PROCEDURE  The insertion site may be tender for a few days after your procedure. HOME CARE INSTRUCTIONS   Take medicines only as directed by your health care provider. Blood thinners may be prescribed after your procedure to improve blood flow through the stent.  Change any bandages (dressings) as directed by your health care provider.   Check your insertion site every day for redness, swelling, or fluid leaking from the insertion.   Do not take baths, swim, or use a hot tub until your health care provider approves. You may shower. Pat the insertion area dry. Do not rub the insertion area with a washcloth or towel.   Eat a heart-healthy diet. This should include plenty of fresh fruits and vegetables. Meat should be lean cuts. Avoid the following types of food:   Food that is high in salt.   Canned or highly processed food.   Food that is high in saturated fat or sugar.   Fried food.   Make any other lifestyle changes recommended by your health care provider. This may include:   Not using any tobacco products including cigarettes, chewing tobacco, or electronic cigarettes.  Managing your weight.   Getting regular exercise.   Managing your blood pressure.   Limiting your alcohol intake.   Managing other health problems, such as diabetes.   If you need an MRI after your heart stent was placed, be sure to tell the health care provider who orders the MRI that you have a heart stent.   Keep all follow-up  visits as directed by your health care provider.  SEEK IMMEDIATE MEDICAL CARE IF:   You develop chest pain, shortness of breath, feel faint, or pass out.  You have bleeding, swelling larger than a walnut, or drainage from the catheter insertion site.  You develop pain, discoloration, coldness, or severe bruising in the leg or arm that held the catheter.  You develop bleeding from any other place such as from the bowels. There may be bright red blood in the urine or stools, or it may appear as black, tarry stools.  You have a fever or chills. MAKE SURE YOU:  Understand these instructions.  Will watch your condition.  Will get help right away if you are not doing well or get worse. Document Released: 05/09/2005 Document Revised: 03/06/2014 Document Reviewed: 03/23/2013 [facility] Patient Information 2015 [place], [place]. This information is not intended to replace advice given to you by your health care provider. Make sure you discuss any questions you have with your health care provider.  No driving for 24 hours. No lifting over 5 lbs for 1 week. No sexual activity for 1 week. You may return to work on 12/18/2014. Keep procedure site clean & dry. If you notice increased pain, swelling, bleeding or pus, call/return!  You may shower, but no soaking baths/hot tubs/pools for 1 week.

## 2014-12-13 NOTE — Telephone Encounter (Signed)
Coverage approval from [facility] for Brilinta 90 mg. PA # [id number] approved until 12/14/2015. Sent approval letter for scanning

## 2014-12-13 NOTE — Progress Notes (Addendum)
CARDIAC REHAB PHASE I   PRE:  Rate/Rhythm: 75 SR  BP:  Supine: 119/50  Sitting:   Standing:    SaO2:   MODE:  Ambulation: 1000 ft   POST:  Rate/Rhythm: 95 SR  BP:  Supine:   Sitting: 157/72  Standing:    SaO2:  1216-2446 Pt tolerated ambulation well without c/o of cp or SOB. VS stable Pt to side of bed after walk with call light in reach. Completed stent discharge education with pt and family. Pt voices understanding. Pt agrees to [facility] in [place], will send referral. We discussed smoking cessation. Pt given coaching contact number, quit smart class information and tips for quitting. Pt has the desire to quit and has done so several times.he feels what has worked best for her has been Wellbutrin. She is asking for a prescription for it. Pt was very excited how much better she feels walking since her stents yesterdaqy.  Rodney Langton RN 12/13/2014 9:23 AM

## 2014-12-18 ENCOUNTER — Ambulatory Visit (HOSPITAL_COMMUNITY): Payer: 59

## 2014-12-18 ENCOUNTER — Encounter (HOSPITAL_COMMUNITY): Payer: 59

## 2015-01-02 ENCOUNTER — Encounter: Payer: 59 | Admitting: Cardiology

## 2015-01-02 ENCOUNTER — Encounter: Payer: Self-pay | Admitting: Cardiology

## 2015-01-02 ENCOUNTER — Ambulatory Visit (INDEPENDENT_AMBULATORY_CARE_PROVIDER_SITE_OTHER): Payer: 59 | Admitting: Cardiology

## 2015-01-02 VITALS — BP 123/79 | HR 56 | Ht 66.0 in | Wt 191.1 lb

## 2015-01-02 DIAGNOSIS — E785 Hyperlipidemia, unspecified: Secondary | ICD-10-CM

## 2015-01-02 DIAGNOSIS — I2510 Atherosclerotic heart disease of native coronary artery without angina pectoris: Secondary | ICD-10-CM

## 2015-01-02 DIAGNOSIS — I10 Essential (primary) hypertension: Secondary | ICD-10-CM

## 2015-01-02 DIAGNOSIS — Z131 Encounter for screening for diabetes mellitus: Secondary | ICD-10-CM

## 2015-01-02 NOTE — Patient Instructions (Signed)
Your physician recommends that you schedule a follow-up appointment in: [address] [place]  Your physician recommends that you continue on your current medications as directed. Please refer to the Current Medication list given to you today.  Your physician recommends that you return for lab work LIPIDS/A1C  Thank you for choosing [facility]!!

## 2015-01-02 NOTE — Progress Notes (Signed)
Clinical Summary Maureen Martin is a 54 y.o.female seen today for follow up of the following medical problems.   1. CAD - seen in clinic previously for chest pain, referred for lexiscan MPI - 12/2014 Lexiscan with large area of ischemia in lateral and inferolateral walls. Referred for cath - cath 12/2014 LM patent, LAD patent, LCX 20% ostial, OM1 99%, RCA 99% prox with more distal thrombus. - s/p stenting to RCA with DES x 3 and also DES to OM1. LVEF 65% - medical therapy somewhat limited during admission due to low bp's. Myalgias on lipitor previously, currently on simva.   - denies any chest pain. No SOB or DOE - compliant with meds   2. Hyperlipidemia - compliant with simvastatin.   3. HTN - compliant with meds  4. Tobacco abuse - started on wellbutrin while in hospital - decreased use since admission  Past Medical History  Diagnosis Date  . Tobacco abuse   . Hyperlipidemia 04/27/2014  . HTN (hypertension) 04/27/2014  . Coronary artery disease     Cath 12/12/2014 EF 65%, 99% prox RCA s/p DESx2, 60-70% mid RCA, 99% LCx marginal s/p DES  . Hyperthyroidism   . Hypothyroidism, postradioiodine therapy     "had radiation; no thyroid now"     Allergies  Allergen Reactions  . Prednisone Other (See Comments)    Made aggressive.     Current Outpatient Prescriptions  Medication Sig Dispense Refill  . acetaminophen (TYLENOL) 500 MG tablet Take 1-2 tablets (500-1,000 mg total) by mouth every 6 (six) hours as needed. (Patient taking differently: Take 500-1,000 mg by mouth every 6 (six) hours as needed (pain). )    . aspirin EC 81 MG tablet Take 1 tablet (81 mg total) by mouth daily.    Marland Kitchen buPROPion (ZYBAN) 150 MG 12 hr tablet Take 150mg  daily for 3 days, then transition to 150mg  twice a day for 7-12 weeks until quit smoking 60 tablet 2  . levothyroxine (SYNTHROID, LEVOTHROID) 50 MCG tablet Take 50 mcg by mouth daily.    . metoprolol tartrate (LOPRESSOR) 25 MG tablet Take 0.5  tablets (12.5 mg total) by mouth 2 (two) times daily. Start medication when SBP > 110 30 tablet 5  . Multiple Vitamins-Minerals (HAIR/SKIN/NAILS PO) Take 1 tablet by mouth 3 (three) times daily.    . nitroGLYCERIN (NITROSTAT) 0.4 MG SL tablet Place 1 tablet (0.4 mg total) under the tongue every 5 (five) minutes as needed for chest pain. 25 tablet 3  . OVER THE COUNTER MEDICATION Take 1 tablet by mouth daily. True Heart    . pantoprazole (PROTONIX) 40 MG tablet Take 1 tablet (40 mg total) by mouth daily. 30 tablet 3  . simvastatin (ZOCOR) 40 MG tablet Take 1 tablet (40 mg total) by mouth daily at 6 PM. 90 tablet 3  . ticagrelor (BRILINTA) 90 MG TABS tablet Take 1 tablet (90 mg total) by mouth 2 (two) times daily. 180 tablet 3   No current facility-administered medications for this visit.     Past Surgical History  Procedure Laterality Date  . Cesarean section  1983; 1985  . Coronary angioplasty with stent placement  12/12/2014    "3"  . Tonsillectomy  ~ 1979  . Tubal ligation  1985  . Left heart catheterization with coronary angiogram N/A 12/12/2014    Procedure: LEFT HEART CATHETERIZATION WITH CORONARY ANGIOGRAM;  Surgeon: Troy Sine, MD;  Location: [facility] CATH LAB;  Service: Cardiovascular;  Laterality: N/A;  Allergies  Allergen Reactions  . Prednisone Other (See Comments)    Made aggressive.      Family History  Problem Relation Age of Onset  . Coronary artery disease Father   . Valvular heart disease Father      Social History Maureen Martin reports that she has been smoking Cigarettes.  She started smoking about 21 years ago. She has a 15 pack-year smoking history. She has never used smokeless tobacco. Maureen Martin reports that she does not drink alcohol.   Review of Systems CONSTITUTIONAL: No weight loss, fever, chills, weakness or fatigue.  HEENT: Eyes: No visual loss, blurred vision, double vision or yellow sclerae.No hearing loss, sneezing, congestion, runny nose or  sore throat.  SKIN: No rash or itching.  CARDIOVASCULAR: per HPI RESPIRATORY: No shortness of breath, cough or sputum.  GASTROINTESTINAL: No anorexia, nausea, vomiting or diarrhea. No abdominal pain or blood.  GENITOURINARY: No burning on urination, no polyuria NEUROLOGICAL: No headache, dizziness, syncope, paralysis, ataxia, numbness or tingling in the extremities. No change in bowel or bladder control.  MUSCULOSKELETAL: No muscle, back pain, joint pain or stiffness.  LYMPHATICS: No enlarged nodes. No history of splenectomy.  PSYCHIATRIC: No history of depression or anxiety.  ENDOCRINOLOGIC: No reports of sweating, cold or heat intolerance. No polyuria or polydipsia.  Marland Kitchen   Physical Examination p 56 bp 123/79 Wt 191 lbs BMI 31 Gen: resting comfortably, no acute distress HEENT: no scleral icterus, pupils equal round and reactive, no palptable cervical adenopathy,  CV: RRR, no m/r/g, no JVD Resp: Clear to auscultation bilaterally GI: abdomen is soft, non-tender, non-distended, normal bowel sounds, no hepatosplenomegaly MSK: extremities are warm, no edema.  Skin: warm, no rash Neuro:  no focal deficits Psych: appropriate affect   Diagnostic Studies 04/2014 Echo Study Conclusions  - Procedure narrative: Transthoracic echocardiography. Image quality was suboptimal, due to poor sound transmission. - Left ventricle: The cavity size was normal. Wall thickness was increased in a pattern of mild LVH. Systolic function was normal. The estimated ejection fraction was in the range of 60% to 65%. Wall motion was normal; there were no regional wall motion abnormalities. Doppler parameters are consistent with abnormal left ventricular relaxation (grade 1 diastolic dysfunction).  12/2014 Lexiscan MPI IMPRESSION: 1. Large degree of ischemia in the lateral and inferolateral walls extending from the apex to the base.  2. Severe lateral wall hypokinesis to akinesis.  3. Left  ventricular ejection fraction 68%  4. High-risk stress test findings*.   12/2014 Cath HEMODYNAMICS:   Central Aorta: 120/65  Left Ventricle: 120/19  ANGIOGRAPHY:   The left main coronary artery was angiographically normal and bifurcated into the LAD and left circumflex coronary artery.   The LAD was a moderate size vessel that gave rise to 3 diagonal vessels and several septal perforating arteries. There was ignificant collateralization to several vessels of the distal RCA via septal perforators as well as via the apical portion of the LAD.  The left circumflex coronary artery was a moderate size vessel that had smooth 20% ostial narrowing. After a very small proximal Jahree Dermody, flex gave rise to a large OM vessel. At the ostium of this OM vessel. There was a 99% stenosis. There was some collateralization to the distal RCA via the distal AV groove circumflex vessel.   The RCA was a large caliber dominant vessel which gave rise to a large PDA and PLA vessel. There was a 99% stenosis in the proximal bend of the RCA. Immediately beyond this  was an area of haziness due to thrombus. There was a 60-70% smooth mid stenosis before the anterior RV marginal Lurae Hornbrook. There was a 90% stenosis immediately proximal to the acute margin. The PDA had diffuse smooth 50% stenoses. There was antegrade filling down the PDA and PLA vessel which also had left to right collateral filling.  Left ventriculography revealed hyperdynamic global LV contractility without focal segmental wall motion abnormalities. There was no evidence for mitral regurgitation. Ejection fraction was 65%.  Following percutaneous coronary intervention with PTCA/DES stenting of the three proximal, mid and mid-distal RCA lesions and insertion of tandem 3.538 mm distally and 3.515 mm Resolute DES stents proximally, the entire proximal to acute margin region was reduced to 0%. There was brisk TIMI-3 flow. There was no evidence for  dissection. Repeat angiography in the left coronary system after successful RCA stenting no longer showed the previous significant collateralization left-to-right since now the RCA antegrade flow was excellent.  Following PTCA/stenting of the circumflex vessel with insertion of a 3.018 mm Resolute integrity DES stent postdilated to 3.25 mm extending from the proximal circumflex into the OM1 vessel, the entire region was reduced to 0%. There was no evidence for dissection.    IMPRESSION:  Normal left ventricular function with an ejection fraction of 65% without wall motion abnormalities.  Significant 2 vessel coronary obstructive disease with normal left main and LAD; 20% smooth ostial narrowing of the circumflex with 99% circumflex marginal stenosis with initial significant 3 site collateralization to the distal RCA, both via the LAD and circumflex vessel; and 99% proximal RCA stenosis with thrombus, 60-70% mid RCA stenosis and 90% RCA stenosis just proximal to the acute margin and 50% smooth PDA stenoses.  Successful 2 vessel percutaneous coronary intervention with PTCA/stenting of 3 sites in the proximal, mid and mid-distal RCA with ultimate insertion of tandem 3.538 and 3.515 mm Resolute DES stents with the stenoses being reduced to 0%, and successful PTCA/stenting of the circumflex/OM1 vessel with a 99% stenosis being reduced to 0% With insertion of a 3.018 mm Resolute DES stent   RECOMMENDATION:  Patient will be maintained on dual antiplatelet therapy for a minimum of a year and probably longer. She will be treated with aggressive statin therapy, beta blocker, ACE inhibitor. Smoking cessation is imperative.  Assessment and Plan  1. CAD - recent PCI to RCA and OM1, doing well postprocedure - continue secondary prevention and risk factor modification. DAPT at least until 12/2015  2. Hyperlipidemia - repeat lipid panel. Side effects on lipitor previously, currently on simva.  Likely will try her on crestor  3. HTN - at goal, continue current meds. Will consdier low dose ACE at f/u pending bp trends  4. Tobacco abuse - continue wellbutrin, she is weaning.       Arnoldo Lenis, M.D.

## 2015-01-05 ENCOUNTER — Other Ambulatory Visit: Payer: Self-pay | Admitting: Cardiology

## 2015-01-05 LAB — HEMOGLOBIN A1C
Hgb A1c MFr Bld: 5.5 % (ref <5.7)
Mean Plasma Glucose: 111 mg/dL (ref <117)

## 2015-01-05 LAB — LIPID PANEL
CHOLESTEROL: 202 mg/dL — AB (ref 0–200)
HDL: 46 mg/dL (ref >=46)
LDL Cholesterol: 138 mg/dL — ABNORMAL HIGH (ref 0–99)
Total CHOL/HDL Ratio: 4.4 Ratio
Triglycerides: 91 mg/dL (ref <150)
VLDL: 18 mg/dL (ref 0–40)

## 2015-01-08 ENCOUNTER — Telehealth: Payer: Self-pay | Admitting: *Deleted

## 2015-01-08 MED ORDER — ROSUVASTATIN CALCIUM 5 MG PO TABS: 5.0000 mg | ORAL_TABLET | Freq: Every day | ORAL | Status: DC

## 2015-01-08 NOTE — Telephone Encounter (Signed)
Pt made aware. Forwarded to Dr. Manuella Ghazi. Pt verbalized understanding of stopping simvastatin and starting crestor 5 mg. Medication sent to pharmacy. Pt will f/u on tolerating Crestor.

## 2015-01-08 NOTE — Telephone Encounter (Signed)
-----   Message from Arnoldo Lenis, MD sent at 01/08/2015 12:54 PM EST ----- Labs show cholesterol is too high. Would like to stop her simvastatin and try her on crestor 5mg  daily.   Zandra Abts MD

## 2015-05-21 ENCOUNTER — Ambulatory Visit: Payer: 59 | Admitting: Cardiology

## 2015-06-11 ENCOUNTER — Ambulatory Visit (INDEPENDENT_AMBULATORY_CARE_PROVIDER_SITE_OTHER): Payer: 59 | Admitting: Cardiology

## 2015-06-11 ENCOUNTER — Encounter: Payer: Self-pay | Admitting: Cardiology

## 2015-06-11 VITALS — BP 103/68 | HR 51 | Ht 66.0 in | Wt 199.0 lb

## 2015-06-11 DIAGNOSIS — I2510 Atherosclerotic heart disease of native coronary artery without angina pectoris: Secondary | ICD-10-CM | POA: Diagnosis not present

## 2015-06-11 DIAGNOSIS — E785 Hyperlipidemia, unspecified: Secondary | ICD-10-CM

## 2015-06-11 DIAGNOSIS — I10 Essential (primary) hypertension: Secondary | ICD-10-CM | POA: Diagnosis not present

## 2015-06-11 NOTE — Patient Instructions (Signed)
Your physician recommends that you continue on your current medications as directed. Please refer to the Current Medication list given to you today. You have been referred to Cardiac Rehab. Your physician recommends that you schedule a follow-up appointment in: 6 months. You will receive a reminder letter in the mail in about 4 months reminding you to call and schedule your appointment. If you don't receive this letter, please contact our office.

## 2015-06-11 NOTE — Progress Notes (Signed)
Patient ID: PRICILA BRIDGE, female   DOB: 1961/09/17, 54 y.o.   MRN: [id number]     Clinical Summary Ms. Tapanes is a 54 y.o.female seen today for follow up of the following medical problems.   1. CAD - 12/2014 Lexiscan with large area of ischemia in lateral and inferolateral walls. Referred for cath - cath 12/2014 LM patent, LAD patent, LCX 20% ostial, OM1 99%, RCA 99% prox with more distal thrombus. - s/p stenting to RCA with DES x 3 and also DES to OM1. LVEF 65% - medical therapy somewhat limited during admission due to low bp's. Myalgias on lipitor previously, currently on crestor and tolerating  - denies any chest pain. No SOB or DOE - compliant with meds   2. Hyperlipidemia - side effects on lipitor, changed to atorva 5mg  daily and tolerating well - she reports recent repeat lipid panel with pcp  3. HTN - compliant with meds    Past Medical History  Diagnosis Date  . Tobacco abuse   . Hyperlipidemia 04/27/2014  . HTN (hypertension) 04/27/2014  . Coronary artery disease     Cath 12/12/2014 EF 65%, 99% prox RCA s/p DESx2, 60-70% mid RCA, 99% LCx marginal s/p DES  . Hyperthyroidism   . Hypothyroidism, postradioiodine therapy     "had radiation; no thyroid now"     Allergies  Allergen Reactions  . Prednisone Other (See Comments)    Made aggressive.     Current Outpatient Prescriptions  Medication Sig Dispense Refill  . acetaminophen (TYLENOL) 500 MG tablet Take 1-2 tablets (500-1,000 mg total) by mouth every 6 (six) hours as needed. (Patient taking differently: Take 500-1,000 mg by mouth every 6 (six) hours as needed (pain). )    . aspirin EC 81 MG tablet Take 1 tablet (81 mg total) by mouth daily.    Marland Kitchen buPROPion (ZYBAN) 150 MG 12 hr tablet Take 150mg  daily for 3 days, then transition to 150mg  twice a day for 7-12 weeks until quit smoking 60 tablet 2  . levothyroxine (SYNTHROID, LEVOTHROID) 50 MCG tablet Take 50 mcg by mouth daily.    . metoprolol tartrate  (LOPRESSOR) 25 MG tablet Take 0.5 tablets (12.5 mg total) by mouth 2 (two) times daily. Start medication when SBP > 110 30 tablet 5  . Multiple Vitamins-Minerals (HAIR/SKIN/NAILS PO) Take 1 tablet by mouth 3 (three) times daily.    . nitroGLYCERIN (NITROSTAT) 0.4 MG SL tablet Place 1 tablet (0.4 mg total) under the tongue every 5 (five) minutes as needed for chest pain. 25 tablet 3  . OVER THE COUNTER MEDICATION Take 1 tablet by mouth daily. True Heart    . pantoprazole (PROTONIX) 40 MG tablet Take 1 tablet (40 mg total) by mouth daily. 30 tablet 3  . rosuvastatin (CRESTOR) 5 MG tablet Take 1 tablet (5 mg total) by mouth daily. 90 tablet 1  . ticagrelor (BRILINTA) 90 MG TABS tablet Take 1 tablet (90 mg total) by mouth 2 (two) times daily. 180 tablet 3   No current facility-administered medications for this visit.     Past Surgical History  Procedure Laterality Date  . Cesarean section  1983; 1985  . Coronary angioplasty with stent placement  12/12/2014    "3"  . Tonsillectomy  ~ 1979  . Tubal ligation  1985  . Left heart catheterization with coronary angiogram N/A 12/12/2014    Procedure: LEFT HEART CATHETERIZATION WITH CORONARY ANGIOGRAM;  Surgeon: Troy Sine, MD;  Location: [facility] CATH LAB;  Service:  Cardiovascular;  Laterality: N/A;     Allergies  Allergen Reactions  . Prednisone Other (See Comments)    Made aggressive.      Family History  Problem Relation Age of Onset  . Coronary artery disease Father   . Valvular heart disease Father      Social History Ms. Croswell reports that she has been smoking Cigarettes.  She started smoking about 22 years ago. She has a 10 pack-year smoking history. She has never used smokeless tobacco. Ms. Minish reports that she does not drink alcohol.   Review of Systems CONSTITUTIONAL: No weight loss, fever, chills, weakness or fatigue.  HEENT: Eyes: No visual loss, blurred vision, double vision or yellow sclerae.No hearing loss,  sneezing, congestion, runny nose or sore throat.  SKIN: No rash or itching.  CARDIOVASCULAR: per HPI RESPIRATORY: No shortness of breath, cough or sputum.  GASTROINTESTINAL: No anorexia, nausea, vomiting or diarrhea. No abdominal pain or blood.  GENITOURINARY: No burning on urination, no polyuria NEUROLOGICAL: No headache, dizziness, syncope, paralysis, ataxia, numbness or tingling in the extremities. No change in bowel or bladder control.  MUSCULOSKELETAL: No muscle, back pain, joint pain or stiffness.  LYMPHATICS: No enlarged nodes. No history of splenectomy.  PSYCHIATRIC: No history of depression or anxiety.  ENDOCRINOLOGIC: No reports of sweating, cold or heat intolerance. No polyuria or polydipsia.  Marland Kitchen   Physical Examination Filed Vitals:   06/11/15 1129  BP: 103/68  Pulse: 51   Filed Vitals:   06/11/15 1129  Height: 5\' 6"  (1.676 m)  Weight: 199 lb (90.266 kg)    Gen: resting comfortably, no acute distress HEENT: no scleral icterus, pupils equal round and reactive, no palptable cervical adenopathy,  CV: RRR, no m/r/g, no JVD Resp: Clear to auscultation bilaterally GI: abdomen is soft, non-tender, non-distended, normal bowel sounds, no hepatosplenomegaly MSK: extremities are warm, no edema.  Skin: warm, no rash Neuro:  no focal deficits Psych: appropriate affect   Diagnostic Studies 04/2014 Echo Study Conclusions  - Procedure narrative: Transthoracic echocardiography. Image quality was suboptimal, due to poor sound transmission. - Left ventricle: The cavity size was normal. Wall thickness was increased in a pattern of mild LVH. Systolic function was normal. The estimated ejection fraction was in the range of 60% to 65%. Wall motion was normal; there were no regional wall motion abnormalities. Doppler parameters are consistent with abnormal left ventricular relaxation (grade 1 diastolic dysfunction).  12/2014 Lexiscan MPI IMPRESSION: 1. Large degree of  ischemia in the lateral and inferolateral walls extending from the apex to the base.  2. Severe lateral wall hypokinesis to akinesis.  3. Left ventricular ejection fraction 68%  4. High-risk stress test findings*.   12/2014 Cath HEMODYNAMICS:   Central Aorta: 120/65  Left Ventricle: 120/19  ANGIOGRAPHY:   The left main coronary artery was angiographically normal and bifurcated into the LAD and left circumflex coronary artery.   The LAD was a moderate size vessel that gave rise to 3 diagonal vessels and several septal perforating arteries. There was ignificant collateralization to several vessels of the distal RCA via septal perforators as well as via the apical portion of the LAD.  The left circumflex coronary artery was a moderate size vessel that had smooth 20% ostial narrowing. After a very small proximal Cristin Penaflor, flex gave rise to a large OM vessel. At the ostium of this OM vessel. There was a 99% stenosis. There was some collateralization to the distal RCA via the distal AV groove circumflex vessel.  The RCA was a large caliber dominant vessel which gave rise to a large PDA and PLA vessel. There was a 99% stenosis in the proximal bend of the RCA. Immediately beyond this was an area of haziness due to thrombus. There was a 60-70% smooth mid stenosis before the anterior RV marginal [place] Murtha. There was a 90% stenosis immediately proximal to the acute margin. The PDA had diffuse smooth 50% stenoses. There was antegrade filling down the PDA and PLA vessel which also had left to right collateral filling.  Left ventriculography revealed hyperdynamic global LV contractility without focal segmental wall motion abnormalities. There was no evidence for mitral regurgitation. Ejection fraction was 65%.  Following percutaneous coronary intervention with PTCA/DES stenting of the three proximal, mid and mid-distal RCA lesions and insertion of tandem 3.538 mm distally and 3.515 mm  Resolute DES stents proximally, the entire proximal to acute margin region was reduced to 0%. There was brisk TIMI-3 flow. There was no evidence for dissection. Repeat angiography in the left coronary system after successful RCA stenting no longer showed the previous significant collateralization left-to-right since now the RCA antegrade flow was excellent.  Following PTCA/stenting of the circumflex vessel with insertion of a 3.018 mm Resolute integrity DES stent postdilated to 3.25 mm extending from the proximal circumflex into the OM1 vessel, the entire region was reduced to 0%. There was no evidence for dissection.    IMPRESSION:  Normal left ventricular function with an ejection fraction of 65% without wall motion abnormalities.  Significant 2 vessel coronary obstructive disease with normal left main and LAD; 20% smooth ostial narrowing of the circumflex with 99% circumflex marginal stenosis with initial significant 3 site collateralization to the distal RCA, both via the LAD and circumflex vessel; and 99% proximal RCA stenosis with thrombus, 60-70% mid RCA stenosis and 90% RCA stenosis just proximal to the acute margin and 50% smooth PDA stenoses.  Successful 2 vessel percutaneous coronary intervention with PTCA/stenting of 3 sites in the proximal, mid and mid-distal RCA with ultimate insertion of tandem 3.538 and 3.515 mm Resolute DES stents with the stenoses being reduced to 0%, and successful PTCA/stenting of the circumflex/OM1 vessel with a 99% stenosis being reduced to 0% With insertion of a 3.018 mm Resolute DES stent   RECOMMENDATION:  Patient will be maintained on dual antiplatelet therapy for a minimum of a year and probably longer. She will be treated with aggressive statin therapy, beta blocker, ACE inhibitor. Smoking cessation is imperative.    Assessment and Plan  1. CAD -PCI to RCA and OM1 12/2014 - continue secondary prevention and risk factor modification. DAPT  at least until 12/2015 - no current symptoms - refer to cardiac rehab  2. Hyperlipidemia - will request most recent lipid panel from pcp - side effects on lipitor, tolerating crestor well.   3. HTN - at goal, continue current meds.   .   F/u 6 months    Arnoldo Lenis, M.D.

## 2015-06-13 ENCOUNTER — Encounter: Payer: Self-pay | Admitting: *Deleted

## 2015-06-25 ENCOUNTER — Other Ambulatory Visit: Payer: Self-pay | Admitting: Cardiology

## 2015-06-25 MED ORDER — METOPROLOL TARTRATE 25 MG PO TABS: 12.5000 mg | ORAL_TABLET | Freq: Two times a day (BID) | ORAL | Status: DC

## 2015-06-25 NOTE — Telephone Encounter (Signed)
Medication sent to pharmacy  

## 2015-06-25 NOTE — Telephone Encounter (Signed)
Patient called stating that she needs refill on metoprolol tartrate (LOPRESSOR) 25 MG tablet   [facility]

## 2015-08-23 ENCOUNTER — Telehealth: Payer: Self-pay | Admitting: Cardiology

## 2015-08-23 NOTE — Telephone Encounter (Signed)
Patient called stating that she has been tested positive for hemoccult per Dr. Manuella Ghazi. Requesting to have colonoscopy but needs to Verify about patient coming off medications.  Patient is being referred to Dr. Laural Golden.   Please call # [phone]  (work) you may Leave a message.

## 2015-08-23 NOTE — Telephone Encounter (Signed)
Left message on patient's voicemail stating that she should have Dr. Olevia Perches office contact us directly about whatever questions they may have pertaining to her procedure and medications.

## 2015-09-07 ENCOUNTER — Encounter (INDEPENDENT_AMBULATORY_CARE_PROVIDER_SITE_OTHER): Payer: Self-pay | Admitting: *Deleted

## 2015-09-14 ENCOUNTER — Other Ambulatory Visit: Payer: Self-pay | Admitting: *Deleted

## 2015-09-14 MED ORDER — ROSUVASTATIN CALCIUM 5 MG PO TABS
5.0000 mg | ORAL_TABLET | Freq: Every day | ORAL | Status: DC
Start: 2015-09-14 — End: 2017-02-17

## 2015-09-17 ENCOUNTER — Other Ambulatory Visit (INDEPENDENT_AMBULATORY_CARE_PROVIDER_SITE_OTHER): Payer: Self-pay | Admitting: Internal Medicine

## 2015-09-17 ENCOUNTER — Telehealth (INDEPENDENT_AMBULATORY_CARE_PROVIDER_SITE_OTHER): Payer: Self-pay | Admitting: *Deleted

## 2015-09-17 ENCOUNTER — Ambulatory Visit (INDEPENDENT_AMBULATORY_CARE_PROVIDER_SITE_OTHER): Payer: 59 | Admitting: Internal Medicine

## 2015-09-17 ENCOUNTER — Encounter (INDEPENDENT_AMBULATORY_CARE_PROVIDER_SITE_OTHER): Payer: Self-pay | Admitting: *Deleted

## 2015-09-17 ENCOUNTER — Encounter (INDEPENDENT_AMBULATORY_CARE_PROVIDER_SITE_OTHER): Payer: Self-pay | Admitting: Internal Medicine

## 2015-09-17 VITALS — BP 116/82 | HR 72 | Temp 98.1°F | Ht 66.0 in | Wt 202.1 lb

## 2015-09-17 DIAGNOSIS — K921 Melena: Secondary | ICD-10-CM | POA: Diagnosis not present

## 2015-09-17 DIAGNOSIS — Z1211 Encounter for screening for malignant neoplasm of colon: Secondary | ICD-10-CM

## 2015-09-17 DIAGNOSIS — R195 Other fecal abnormalities: Secondary | ICD-10-CM

## 2015-09-17 NOTE — Telephone Encounter (Signed)
Patient should delay procedures until February next year. Patient needs to have CBC to make sure she is not becoming anemic

## 2015-09-17 NOTE — Telephone Encounter (Signed)
Patient with 4 stents placed 12/2014, including 3 tandem stents in RCA. Recently had heme + stool card, episode of melena and blood reported in GI notes, asked about holding brilinta for EGD and colonoscopy. Ideally given her stent burden would recommend 1 year of uninterupted aspirn and brilinita. If urgent indication based on GI issues for procedures, given that she has completed 9 months of therapy, it is reasonable to hold brilinta if neccesary. This would all be based on the severity of the bleeding. If isolated episode with normal Hgb I would consider continuing to hold on EGD/colonoscopy. If recurrent episodes of bleeding or anemia is present can hold brilinta as needed and proceed.    Zandra Abts MD

## 2015-09-17 NOTE — Telephone Encounter (Signed)
Dr Laural Golden, please advise if you want patient to hold brilinta given Dr Harl Bowie recommendation, thanks

## 2015-09-17 NOTE — Telephone Encounter (Signed)
Terri please review Dr Laural Golden & Dr Harl Bowie recommendation

## 2015-09-17 NOTE — Telephone Encounter (Signed)
Patient is scheduled for colonoscopy and upper endoscopy on 10/24/15, she needs to stop Brilinta 1 day prior to procedure, is this ok, please advise

## 2015-09-17 NOTE — Progress Notes (Signed)
Subjective:    Patient ID: Maureen Martin, female    DOB: December 31, 1960, 54 y.o.   MRN: [id number]  HPI Referred to our office by Dr. Manuella Ghazi for positive stool card. She says she has seen blood x 1. She describes as black and tarry x 1.  She has not seen any blood since. Occurred around the middle of October of this year. Appetite is good. No weight loss. No dysphagia. No acid reflux. There is no abdominal pain. She has a BM daily. Denies any recent melena or BRRB. No change in her stools. No family hx of colon cancer. Hx significant for cardiac disease. 4 cardiac stents placed in February 2016. Maintained on [place]. She has never undergone a colonoscopy. She had a CBC last month and she says it was normal. (Dr. Manuella Ghazi).    Review of Systems Past Medical History  Diagnosis Date  . Tobacco abuse   . Hyperlipidemia 04/27/2014  . HTN (hypertension) 04/27/2014  . Coronary artery disease     Cath 12/12/2014 EF 65%, 99% prox RCA s/p DESx2, 60-70% mid RCA, 99% LCx marginal s/p DES  . Hyperthyroidism   . Hypothyroidism, postradioiodine therapy     "had radiation; no thyroid now"    Past Surgical History  Procedure Laterality Date  . Cesarean section  1983; 1985  . Coronary angioplasty with stent placement  12/12/2014    "3"  . Tonsillectomy  ~ 1979  . Tubal ligation  1985  . Left heart catheterization with coronary angiogram N/A 12/12/2014    Procedure: LEFT HEART CATHETERIZATION WITH CORONARY ANGIOGRAM;  Surgeon: Troy Sine, MD;  Location: [facility] CATH LAB;  Service: Cardiovascular;  Laterality: N/A;    Allergies  Allergen Reactions  . Prednisone Other (See Comments)    Made aggressive.    Current Outpatient Prescriptions on File Prior to Visit  Medication Sig Dispense Refill  . acetaminophen (TYLENOL) 500 MG tablet Take 1-2 tablets (500-1,000 mg total) by mouth every 6 (six) hours as needed. (Patient taking differently: Take 500-1,000 mg by mouth every 6 (six) hours as needed (pain).  )    . aspirin EC 81 MG tablet Take 1 tablet (81 mg total) by mouth daily.    Marland Kitchen levothyroxine (SYNTHROID, LEVOTHROID) 75 MCG tablet Take 75 mcg by mouth daily before breakfast.    . metoprolol tartrate (LOPRESSOR) 25 MG tablet Take 0.5 tablets (12.5 mg total) by mouth 2 (two) times daily. Start medication when SBP > 110 30 tablet 6  . nitroGLYCERIN (NITROSTAT) 0.4 MG SL tablet Place 1 tablet (0.4 mg total) under the tongue every 5 (five) minutes as needed for chest pain. 25 tablet 3  . rosuvastatin (CRESTOR) 5 MG tablet Take 1 tablet (5 mg total) by mouth daily. 90 tablet 3  . ticagrelor (BRILINTA) 90 MG TABS tablet Take 1 tablet (90 mg total) by mouth 2 (two) times daily. 180 tablet 3   No current facility-administered medications on file prior to visit.        Objective:   Physical Exam Blood pressure 116/82, pulse 72, temperature 98.1 F (36.7 C), height 5\' 6"  (1.676 m), weight 202 lb 1.6 oz (91.672 kg).  Alert and oriented. Skin warm and dry. Oral mucosa is moist.   . Sclera anicteric, conjunctivae is pink. Thyroid not enlarged. No cervical lymphadenopathy. Lungs clear. Heart regular rate and rhythm.  Abdomen is soft. Bowel sounds are positive. No hepatomegaly. No abdominal masses felt. No tenderness.  No edema to lower extremities.  Assessment & Plan:  Heme positive stool card. Melena. PUD needs to be ruled out. Patient maintained on Brilinta.  EGD Screening colonoscopy. The risks and benefits such as perforation, bleeding, and infection were reviewed with the patient and is agreeable.

## 2015-09-17 NOTE — Telephone Encounter (Signed)
Patient needs trilyte 

## 2015-09-21 ENCOUNTER — Encounter (INDEPENDENT_AMBULATORY_CARE_PROVIDER_SITE_OTHER): Payer: Self-pay

## 2015-10-24 ENCOUNTER — Ambulatory Visit: Admit: 2015-10-24 | Payer: 59 | Admitting: Internal Medicine

## 2015-10-24 SURGERY — EGD (ESOPHAGOGASTRODUODENOSCOPY)
Anesthesia: Moderate Sedation

## 2015-12-10 ENCOUNTER — Ambulatory Visit (INDEPENDENT_AMBULATORY_CARE_PROVIDER_SITE_OTHER): Payer: 59 | Admitting: Internal Medicine

## 2015-12-31 ENCOUNTER — Other Ambulatory Visit: Payer: Self-pay | Admitting: *Deleted

## 2015-12-31 MED ORDER — TICAGRELOR 90 MG PO TABS: 90.0000 mg | ORAL_TABLET | Freq: Two times a day (BID) | ORAL | Status: DC

## 2016-05-06 ENCOUNTER — Other Ambulatory Visit: Payer: Self-pay | Admitting: Cardiology

## 2016-12-23 ENCOUNTER — Ambulatory Visit (INDEPENDENT_AMBULATORY_CARE_PROVIDER_SITE_OTHER): Payer: Self-pay | Admitting: Internal Medicine

## 2016-12-25 ENCOUNTER — Encounter (INDEPENDENT_AMBULATORY_CARE_PROVIDER_SITE_OTHER): Payer: Self-pay | Admitting: *Deleted

## 2016-12-25 ENCOUNTER — Encounter (INDEPENDENT_AMBULATORY_CARE_PROVIDER_SITE_OTHER): Payer: Self-pay | Admitting: Internal Medicine

## 2016-12-25 ENCOUNTER — Other Ambulatory Visit (INDEPENDENT_AMBULATORY_CARE_PROVIDER_SITE_OTHER): Payer: Self-pay | Admitting: Internal Medicine

## 2016-12-25 ENCOUNTER — Telehealth (INDEPENDENT_AMBULATORY_CARE_PROVIDER_SITE_OTHER): Payer: Self-pay | Admitting: *Deleted

## 2016-12-25 ENCOUNTER — Ambulatory Visit (INDEPENDENT_AMBULATORY_CARE_PROVIDER_SITE_OTHER): Payer: 59 | Admitting: Internal Medicine

## 2016-12-25 VITALS — BP 146/80 | HR 64 | Temp 97.5°F | Ht 66.0 in | Wt 213.4 lb

## 2016-12-25 DIAGNOSIS — Z79899 Other long term (current) drug therapy: Secondary | ICD-10-CM | POA: Diagnosis not present

## 2016-12-25 DIAGNOSIS — Z1211 Encounter for screening for malignant neoplasm of colon: Secondary | ICD-10-CM

## 2016-12-25 MED ORDER — PEG 3350-KCL-NA BICARB-NACL 420 G PO SOLR: 4000.0000 mL | Freq: Once | ORAL | 0 refills | Status: AC

## 2016-12-25 NOTE — Patient Instructions (Signed)
The risks and benefits such as perforation, bleeding, and infection were reviewed with the patient and is agreeable. 

## 2016-12-25 NOTE — Progress Notes (Signed)
Subjective:    Patient ID: Maureen Martin, female    DOB: August 18, 1961, 56 y.o.   MRN: [id number]  HPI Here today for f/u. She was last seen in November of 2016 for positive stool card. She had had black,tarry stool x 1. No family hx of colon cancer. Never undergone a colonoscopy in the past. She was scheduled for an EGD/Colonoscopy but cancelled.  Her cardiologist did not want her to stop the [place] in 2016. Hx of cardiac stent x 4 in 2016 (MI) and was placed on Brilinta.  She tells me she is doing good. Appetite is good.  She usually has a BM daily. No melena or BRRB.         Review of Systems Past Medical History:  Diagnosis Date  . Coronary artery disease    Cath 12/12/2014 EF 65%, 99% prox RCA s/p DESx2, 60-70% mid RCA, 99% LCx marginal s/p DES  . HTN (hypertension) 04/27/2014  . Hyperlipidemia 04/27/2014  . Hyperthyroidism   . Hypothyroidism, postradioiodine therapy    "had radiation; no thyroid now"  . Tobacco abuse     Past Surgical History:  Procedure Laterality Date  . [place]; 1985  . CORONARY ANGIOPLASTY WITH STENT PLACEMENT  12/12/2014   "3"  . LEFT HEART CATHETERIZATION WITH CORONARY ANGIOGRAM N/A 12/12/2014   Procedure: LEFT HEART CATHETERIZATION WITH CORONARY ANGIOGRAM;  Surgeon: Troy Sine, MD;  Location: [facility] CATH LAB;  Service: Cardiovascular;  Laterality: N/A;  . TONSILLECTOMY  ~ 1979  . TUBAL LIGATION  1985    Allergies  Allergen Reactions  . Prednisone Other (See Comments)    Made aggressive.    Current Outpatient Prescriptions on File Prior to Visit  Medication Sig Dispense Refill  . acetaminophen (TYLENOL) 500 MG tablet Take 1-2 tablets (500-1,000 mg total) by mouth every 6 (six) hours as needed. (Patient taking differently: Take 500-1,000 mg by mouth every 6 (six) hours as needed (pain). )    . aspirin EC 81 MG tablet Take 1 tablet (81 mg total) by mouth daily.    . Biotin 1 MG CAPS Take by mouth.    . levothyroxine  (SYNTHROID, LEVOTHROID) 75 MCG tablet Take 88 mcg by mouth daily before breakfast.     . metoprolol tartrate (LOPRESSOR) 25 MG tablet take 1/2 tablet by mouth twice a day START WHEN SBP IS GREATER THAN 110 30 tablet 0  . nitroGLYCERIN (NITROSTAT) 0.4 MG SL tablet Place 1 tablet (0.4 mg total) under the tongue every 5 (five) minutes as needed for chest pain. 25 tablet 3  . rosuvastatin (CRESTOR) 5 MG tablet Take 1 tablet (5 mg total) by mouth daily. (Patient taking differently: Take 20 mg by mouth daily. ) 90 tablet 3   No current facility-administered medications on file prior to visit.        Objective:   Physical Exam Blood pressure (!) 146/80, pulse 64, temperature 97.5 F (36.4 C), height 5\' 6"  (1.676 m), weight 213 lb 6.4 oz (96.8 kg). Alert and oriented. Skin warm and dry. Oral mucosa is moist.   . Sclera anicteric, conjunctivae is pink. Thyroid not enlarged. No cervical lymphadenopathy. Lungs clear. Heart regular rate and rhythm.  Abdomen is soft. Bowel sounds are positive. No hepatomegaly. No abdominal masses felt. No tenderness.  No edema to lower extremities.          Assessment & Plan:  Screening colonoscopy. The risks and benefits such as perforation, bleeding, and infection were reviewed  with the patient and is agreeable.

## 2016-12-25 NOTE — Telephone Encounter (Signed)
Patient needs trilyte 

## 2017-02-18 ENCOUNTER — Ambulatory Visit (HOSPITAL_COMMUNITY)
Admission: RE | Admit: 2017-02-18 | Discharge: 2017-02-18 | Disposition: A | Discharge status: Home / Self Care | Payer: 59 | Source: Ambulatory Visit | Attending: Internal Medicine | Admitting: Internal Medicine

## 2017-02-18 ENCOUNTER — Encounter (HOSPITAL_COMMUNITY)
Admission: RE | Disposition: A | Discharge status: Home / Self Care | Payer: Self-pay | Source: Ambulatory Visit | Attending: Internal Medicine

## 2017-02-18 ENCOUNTER — Encounter (HOSPITAL_COMMUNITY): Payer: Self-pay

## 2017-02-18 DIAGNOSIS — Z1211 Encounter for screening for malignant neoplasm of colon: Secondary | ICD-10-CM | POA: Insufficient documentation

## 2017-02-18 DIAGNOSIS — I2510 Atherosclerotic heart disease of native coronary artery without angina pectoris: Secondary | ICD-10-CM | POA: Diagnosis not present

## 2017-02-18 DIAGNOSIS — E785 Hyperlipidemia, unspecified: Secondary | ICD-10-CM | POA: Insufficient documentation

## 2017-02-18 DIAGNOSIS — F17210 Nicotine dependence, cigarettes, uncomplicated: Secondary | ICD-10-CM | POA: Insufficient documentation

## 2017-02-18 DIAGNOSIS — Z7982 Long term (current) use of aspirin: Secondary | ICD-10-CM | POA: Insufficient documentation

## 2017-02-18 DIAGNOSIS — K644 Residual hemorrhoidal skin tags: Secondary | ICD-10-CM

## 2017-02-18 DIAGNOSIS — D125 Benign neoplasm of sigmoid colon: Secondary | ICD-10-CM | POA: Insufficient documentation

## 2017-02-18 DIAGNOSIS — I10 Essential (primary) hypertension: Secondary | ICD-10-CM | POA: Diagnosis not present

## 2017-02-18 DIAGNOSIS — Z79899 Other long term (current) drug therapy: Secondary | ICD-10-CM | POA: Insufficient documentation

## 2017-02-18 DIAGNOSIS — W881XXA Exposure to radioactive isotopes, initial encounter: Secondary | ICD-10-CM | POA: Insufficient documentation

## 2017-02-18 DIAGNOSIS — E890 Postprocedural hypothyroidism: Secondary | ICD-10-CM | POA: Diagnosis not present

## 2017-02-18 HISTORY — PX: POLYPECTOMY: SHX5525

## 2017-02-18 HISTORY — PX: COLONOSCOPY: SHX5424

## 2017-02-18 SURGERY — COLONOSCOPY
Anesthesia: Moderate Sedation

## 2017-02-18 MED ORDER — MEPERIDINE HCL 50 MG/ML IJ SOLN
INTRAMUSCULAR | Status: DC
Start: 2017-02-18 — End: 2017-02-18
  Filled 2017-02-18: qty 1

## 2017-02-18 MED ORDER — SODIUM CHLORIDE 0.9 % IV SOLN
INTRAVENOUS | Status: DC
  Administered 2017-02-18: 09:00:00 via INTRAVENOUS

## 2017-02-18 MED ORDER — MIDAZOLAM HCL 5 MG/5ML IJ SOLN
INTRAMUSCULAR | Status: AC
  Filled 2017-02-18: qty 10

## 2017-02-18 MED ORDER — MEPERIDINE HCL 50 MG/ML IJ SOLN
INTRAMUSCULAR | Status: AC
  Filled 2017-02-18: qty 1

## 2017-02-18 MED ORDER — MIDAZOLAM HCL 5 MG/5ML IJ SOLN
INTRAMUSCULAR | Status: DC | PRN
  Administered 2017-02-18: 1 mg via INTRAVENOUS
  Administered 2017-02-18: 2 mg via INTRAVENOUS
  Administered 2017-02-18 (×5): 1 mg via INTRAVENOUS
  Administered 2017-02-18: 2 mg via INTRAVENOUS

## 2017-02-18 MED ORDER — MEPERIDINE HCL 50 MG/ML IJ SOLN
INTRAMUSCULAR | Status: DC | PRN
  Administered 2017-02-18 (×4): 25 mg via INTRAVENOUS

## 2017-02-18 NOTE — Discharge Instructions (Signed)
Resume aspirin and Brilinta at usual dose on 02/19/2017. Resume other medications and diet as before. No driving for 24 hours. Physician will call with biopsy results.  Colonoscopy, Adult, Care After This sheet gives you information about how to care for yourself after your procedure. Your doctor may also give you more specific instructions. If you have problems or questions, call your doctor. Follow these instructions at home: General instructions    For the first 24 hours after the procedure:  Do not drive or use machinery.  Do not sign important documents.  Do not drink alcohol.  Do your daily activities more slowly than normal.  Eat foods that are soft and easy to digest.  Rest often.  Take over-the-counter or prescription medicines only as told by your doctor.  It is up to you to get the results of your procedure. Ask your doctor, or the department performing the procedure, when your results will be ready. To help cramping and bloating:   Try walking around.  Put heat on your belly (abdomen) as told by your doctor. Use a heat source that your doctor recommends, such as a moist heat pack or a heating pad.  Put a towel between your skin and the heat source.  Leave the heat on for 20-30 minutes.  Remove the heat if your skin turns bright red. This is especially important if you cannot feel pain, heat, or cold. You can get burned. Eating and drinking   Drink enough fluid to keep your pee (urine) clear or pale yellow.  Return to your normal diet as told by your doctor. Avoid heavy or fried foods that are hard to digest.  Avoid drinking alcohol for as long as told by your doctor. Contact a doctor if:  You have blood in your poop (stool) 2-3 days after the procedure. Get help right away if:  You have more than a small amount of blood in your poop.  You see large clumps of tissue (blood clots) in your poop.  Your belly is swollen.  You feel sick to your stomach  (nauseous).  You throw up (vomit).  You have a fever.  You have belly pain that gets worse, and medicine does not help your pain. This information is not intended to replace advice given to you by your health care provider. Make sure you discuss any questions you have with your health care provider. Document Released: 11/22/2010 Document Revised: 07/14/2016 Document Reviewed: 07/14/2016 Elsevier Interactive Patient Education  2017 [employer].   Colon Polyps Polyps are tissue growths inside the body. Polyps can grow in many places, including the large intestine (colon). A polyp may be a round bump or a mushroom-shaped growth. You could have one polyp or several. Most colon polyps are noncancerous (benign). However, some colon polyps can become cancerous over time. What are the causes? The exact cause of colon polyps is not known. What increases the risk? This condition is more likely to develop in people who:  Have a family history of colon cancer or colon polyps.  Are older than 57 or older than 45 if they are African American.  Have inflammatory bowel disease, such as ulcerative colitis or Crohn disease.  Are overweight.  Smoke cigarettes.  Do not get enough exercise.  Drink too much alcohol.  Eat a diet that is:  High in fat and red meat.  Low in fiber.  Had childhood cancer that was treated with abdominal radiation. What are the signs or symptoms? Most polyps  do not cause symptoms. If you have symptoms, they may include:  Blood coming from your rectum when having a bowel movement.  Blood in your stool.The stool may look dark red or black.  A change in bowel habits, such as constipation or diarrhea. How is this diagnosed? This condition is diagnosed with a colonoscopy. This is a procedure that uses a lighted, flexible scope to look at the inside of your colon. How is this treated? Treatment for this condition involves removing any polyps that are found. Those  polyps will then be tested for cancer. If cancer is found, your health care provider will talk to you about options for colon cancer treatment. Follow these instructions at home: Diet   Eat plenty of fiber, such as fruits, vegetables, and whole grains.  Eat foods that are high in calcium and vitamin D, such as milk, cheese, yogurt, eggs, liver, fish, and broccoli.  Limit foods high in fat, red meats, and processed meats, such as hot dogs, sausage, bacon, and lunch meats.  Maintain a healthy weight, or lose weight if recommended by your health care provider. General instructions   Do not smoke cigarettes.  Do not drink alcohol excessively.  Keep all follow-up visits as told by your health care provider. This is important. This includes keeping regularly scheduled colonoscopies. Talk to your health care provider about when you need a colonoscopy.  Exercise every day or as told by your health care provider. Contact a health care provider if:  You have new or worsening bleeding during a bowel movement.  You have new or increased blood in your stool.  You have a change in bowel habits.  You unexpectedly lose weight. This information is not intended to replace advice given to you by your health care provider. Make sure you discuss any questions you have with your health care provider. Document Released: 07/16/2004 Document Revised: 03/27/2016 Document Reviewed: 09/10/2015 Elsevier Interactive Patient Education  2017 [employer].

## 2017-02-18 NOTE — Op Note (Signed)
[facility] Patient Name: Maureen Martin Procedure Date: 02/18/2017 9:43 AM MRN: [id number] Date of Birth: Apr 06, 1961 Attending MD: Hildred Laser , MD CSN: [id number] Age: 56 Admit Type: Outpatient Procedure:                Colonoscopy Indications:              Screening for colorectal malignant neoplasm Providers:                Hildred Laser, MD, Lurline Del, RN, Charlyne Petrin                            RN, RN Referring MD:             Monico Blitz, MD Medicines:                Meperidine 100 mg IV, Midazolam 10 mg IV Complications:            No immediate complications. Estimated Blood Loss:     Estimated blood loss was minimal. Procedure:                Pre-Anesthesia Assessment:                           - Prior to the procedure, a History and Physical                            was performed, and patient medications and                            allergies were reviewed. The patient's tolerance of                            previous anesthesia was also reviewed. The risks                            and benefits of the procedure and the sedation                            options and risks were discussed with the patient.                            All questions were answered, and informed consent                            was obtained. Prior Anticoagulants: The patient                            last took aspirin 2 days prior to the procedure.                            ASA Grade Assessment: III - A patient with severe                            systemic disease. After reviewing the risks and  benefits, the patient was deemed in satisfactory                            condition to undergo the procedure.                           After obtaining informed consent, the colonoscope                            was passed under direct vision. Throughout the                            procedure, the patient's blood pressure, pulse, and             oxygen saturations were monitored continuously. The                            [id number] ([id number]) scope was introduced through                            the anus and advanced to the the cecum, identified                            by appendiceal orifice and ileocecal valve. The                            colonoscopy was technically difficult and complex                            due to restricted mobility of the colon and a                            tortuous colon. Successful completion of the                            procedure was aided by changing the patient's                            position, withdrawing and reinserting the scope,                            withdrawing the scope and replacing with the                            'babyscope' and applying abdominal pressure. The                            patient tolerated the procedure well. The quality                            of the bowel preparation was good. The ileocecal                            valve, appendiceal orifice, and rectum were  photographed. Scope In: 10:11:58 AM Scope Out: 11:26:02 AM Scope Withdrawal Time: 0 hours 7 minutes 48 seconds  Total Procedure Duration: 1 hour 14 minutes 4 seconds  Findings:      The perianal and digital rectal examinations were normal.      A 5 mm polyp was found in the proximal sigmoid colon. The polyp was       sessile. The polyp was removed with a cold snare. Resection and       retrieval were complete.      The exam was otherwise normal throughout the examined colon.      External hemorrhoids were found during retroflexion. The hemorrhoids       were small. Impression:               - One 5 mm polyp in the proximal sigmoid colon,                            removed with a cold snare. Resected and retrieved.                           - External hemorrhoids. Moderate Sedation:      Moderate (conscious) sedation was administered by the  endoscopy nurse       and supervised by the endoscopist. The following parameters were       monitored: oxygen saturation, heart rate, blood pressure, CO2       capnography and response to care. Total physician intraservice time was       80 minutes. Recommendation:           - Patient has a contact number available for                            emergencies. The signs and symptoms of potential                            delayed complications were discussed with the                            patient. Return to normal activities tomorrow.                            Written discharge instructions were provided to the                            patient.                           - Resume previous diet today.                           - Continue present medications.                           - Resume aspirin tomorrow and previous                            anticoagulant medication tomorrow at prior doses.                           -  Await pathology results.                           - Repeat colonoscopy date to be determined after                            pending pathology results are reviewed. Procedure Code(s):        --- Professional ---                           [phone], Colonoscopy, flexible; with removal of                            tumor(s), polyp(s), or other lesion(s) by snare                            technique                           99152, Moderate sedation services provided by the                            same physician or other qualified health care                            professional performing the diagnostic or                            therapeutic service that the sedation supports,                            requiring the presence of an independent trained                            observer to assist in the monitoring of the                            patient's level of consciousness and physiological                            status; initial 15 minutes of  intraservice time,                            patient age 35 years or older                           [phone], Moderate sedation services; each additional                            15 minutes intraservice time                           99153, Moderate sedation services; each additional                            15  minutes intraservice time                           99153, Moderate sedation services; each additional                            15 minutes intraservice time                           99153, Moderate sedation services; each additional                            15 minutes intraservice time Diagnosis Code(s):        --- Professional ---                           Z12.11, Encounter for screening for malignant                            neoplasm of colon                           D12.5, Benign neoplasm of sigmoid colon                           K64.4, Residual hemorrhoidal skin tags CPT copyright 2016 American Medical Association. All rights reserved. The codes documented in this report are preliminary and upon coder review may  be revised to meet current compliance requirements. Hildred Laser, MD Hildred Laser, MD 02/18/2017 11:40:04 AM This report has been signed electronically. Number of Addenda: 0

## 2017-02-18 NOTE — H&P (Addendum)
Maureen Martin is an 56 y.o. female.   Chief Complaint: Maureen Martin is here for colonoscopy. HPI: Maureen Martin is 56 year old Caucasian female who is here for screening colonoscopy. She denies abdominal pain change in bowel habits or rectal bleeding. She did have single episode of hematochezia when she passed small amount of blood with her bowel movement thought to be due to hemorrhoids. She has been on Brilinta pain on low-dose aspirin. These medications on hold for the procedure. Family History is negative for CRC.  Past Medical History:  Diagnosis Date  . Coronary artery disease    Cath 12/12/2014 EF 65%, 99% prox RCA s/p DESx2, 60-70% mid RCA, 99% LCx marginal s/p DES  . HTN (hypertension) 04/27/2014  . Hyperlipidemia 04/27/2014  . Hyperthyroidism   . Hypothyroidism, postradioiodine therapy    "had radiation; no thyroid now"  . Tobacco abuse     Past Surgical History:  Procedure Laterality Date  . [place]; 1985  . CORONARY ANGIOPLASTY WITH STENT PLACEMENT  12/12/2014   "3"  . LEFT HEART CATHETERIZATION WITH CORONARY ANGIOGRAM N/A 12/12/2014   Procedure: LEFT HEART CATHETERIZATION WITH CORONARY ANGIOGRAM;  Surgeon: Troy Sine, MD;  Location: [facility] CATH LAB;  Service: Cardiovascular;  Laterality: N/A;  . TONSILLECTOMY  ~ 1979  . TUBAL LIGATION  1985    Family History  Problem Relation Age of Onset  . Coronary artery disease Father   . Valvular heart disease Father    Social History:  reports that she has been smoking Cigarettes.  She started smoking about 24 years ago. She has a 10.00 pack-year smoking history. She has never used smokeless tobacco. She reports that she does not drink alcohol or use drugs.  Allergies:  Allergies  Allergen Reactions  . Prednisone Other (See Comments)    Made aggressive.    Medications Prior to Admission  Medication Sig Dispense Refill  . acetaminophen (TYLENOL) 500 MG tablet Take 1-2 tablets (500-1,000 mg total) by mouth every 6 (six)  hours as needed. (Maureen Martin taking differently: Take 500-1,000 mg by mouth every 6 (six) hours as needed (pain). )    . aspirin EC 81 MG tablet Take 1 tablet (81 mg total) by mouth daily.    . Biotin 1 MG CAPS Take 1 capsule by mouth daily.     Marland Kitchen levothyroxine (SYNTHROID, LEVOTHROID) 88 MCG tablet Take 88 mcg by mouth daily before breakfast.   0  . metoprolol tartrate (LOPRESSOR) 25 MG tablet take 1/2 tablet by mouth twice a day START WHEN SBP IS GREATER THAN 110 30 tablet 0  . rosuvastatin (CRESTOR) 20 MG tablet Take 20 mg by mouth every evening.    . nitroGLYCERIN (NITROSTAT) 0.4 MG SL tablet Place 1 tablet (0.4 mg total) under the tongue every 5 (five) minutes as needed for chest pain. 25 tablet 3    No results found for this or any previous visit (from the past 48 hour(s)). No results found.  ROS  Blood pressure (!) 145/66, pulse (!) 57, temperature 97.6 F (36.4 C), temperature source Oral, resp. rate (!) 22, height 5\' 6"  (1.676 m), weight 208 lb (94.3 kg), SpO2 98 %. Physical Exam  Constitutional: She appears well-developed and well-nourished.  HENT:  Mouth/Throat: Oropharynx is clear and moist.  Eyes: Conjunctivae are normal. No scleral icterus.  Neck: No thyromegaly present.  Cardiovascular: Normal rate, regular rhythm and normal heart sounds.   No murmur heard. Respiratory: Effort normal and breath sounds normal.  GI: Soft. She  exhibits no distension and no mass. There is no tenderness.  Musculoskeletal: She exhibits no edema.  Lymphadenopathy:    She has no cervical adenopathy.  Neurological: She is alert.  Skin: Skin is warm and dry.     Assessment/Plan Average risk screening colonoscopy.   02/18/2017, 10:01 AM

## 2017-02-23 ENCOUNTER — Telehealth (INDEPENDENT_AMBULATORY_CARE_PROVIDER_SITE_OTHER): Payer: Self-pay | Admitting: Internal Medicine

## 2017-02-23 NOTE — Telephone Encounter (Signed)
Patient left a message after hours on Friday and stated that she was returning Dr. Olevia Perches call that she missed.  [phone]

## 2017-02-23 NOTE — Telephone Encounter (Signed)
Patient was called and given her results from the Colonoscopy as instructed by Dr.Rehman. Her next Colonoscopy is in 7 years.

## 2017-02-23 NOTE — Telephone Encounter (Signed)
7 yr TCS noted in recall 

## 2017-02-24 ENCOUNTER — Encounter (HOSPITAL_COMMUNITY): Payer: Self-pay | Admitting: Internal Medicine

## 2019-11-30 ENCOUNTER — Telehealth: Payer: Self-pay | Admitting: Cardiology

## 2019-11-30 NOTE — Telephone Encounter (Signed)
Patient called stating that she had stents 6 years ago. Concerned about taking the COVID vaccine.

## 2019-11-30 NOTE — Telephone Encounter (Signed)
Per protocol pt aware that COVID vaccine was ok to take with prior stents

## 2020-11-28 ENCOUNTER — Other Ambulatory Visit: Payer: Self-pay

## 2020-11-28 ENCOUNTER — Encounter: Payer: Self-pay | Admitting: Orthopaedic Surgery

## 2020-11-28 ENCOUNTER — Ambulatory Visit (INDEPENDENT_AMBULATORY_CARE_PROVIDER_SITE_OTHER): Payer: BC Managed Care – PPO | Admitting: Orthopaedic Surgery

## 2020-11-28 DIAGNOSIS — G8929 Other chronic pain: Secondary | ICD-10-CM | POA: Diagnosis not present

## 2020-11-28 DIAGNOSIS — M25562 Pain in left knee: Secondary | ICD-10-CM | POA: Diagnosis not present

## 2020-11-28 NOTE — Progress Notes (Signed)
Office Visit Note   Patient: Maureen Martin           Date of Birth: 1961-04-10           MRN: [id number] Visit Date: 11/28/2020              Requested by: Monico Blitz, MD [address] [place],  [place] [address] PCP: Monico Blitz, MD   Assessment & Plan: Visit Diagnoses:  1. Chronic pain of left knee     Plan: Mrs. Thornsberry been experiencing right knee pain since about October 2021.  She was seen by her primary care physician and suggested using Voltaren gel and Tylenol arthritis.  She actually feels a lot better.  She also had a cortisone injection in October that temporarily gave her some relief of her pain.  It was suggested that she may want to consider Visco supplementation and thus is referred to the office.  I did review her films and did not think there was much in terms of joint loss or osteophyte formation.  She had nice alignment.  For the moment she is doing very well and not having any swelling or sensation of giving way or stiffness.  She is on her feet all day long and functions perfectly well.  I would suggest she continue with the Voltaren gel and Tylenol arthritis.  The next step in my opinion would be an MRI scan.  She will let me know if she is having any further trouble  Follow-Up Instructions: Return if symptoms worsen or fail to improve.   Orders:  No orders of the defined types were placed in this encounter.  No orders of the defined types were placed in this encounter.     Procedures: No procedures performed   Clinical Data: No additional findings.   Subjective: Chief Complaint  Patient presents with  . Left Knee - Pain  Patient presents with left knee pain. She has no known injury. The pain started in October. She has seen [facility]ala who told her to use Voltaren Gel and take Tylenol Arthritis which has helped. She states that he is unable to do the gel injections, so he referred her here to start the process for that treatment.  Presently using Voltaren  gel and Tylenol arthritis with good relief of her pain.  She is using the Voltaren gel maybe once a day.  She is on her feet all day long and is able to function.  Presently she is actually doing quite well.  There is no sensation of her knee giving way or swelling  HPI  Review of Systems   Objective: Vital Signs: Ht 5\' 6"  (1.676 m)   Wt 215 lb (97.5 kg)   BMI 34.70 kg/m   Physical Exam Constitutional:      Appearance: She is well-developed and well-nourished.  HENT:     Mouth/Throat:     Mouth: Oropharynx is clear and moist.  Eyes:     Extraocular Movements: EOM normal.     Pupils: Pupils are equal, round, and reactive to light.  Pulmonary:     Effort: Pulmonary effort is normal.  Skin:    General: Skin is warm and dry.  Neurological:     Mental Status: She is alert and oriented to person, place, and time.  Psychiatric:        Mood and Affect: Mood and affect normal.        Behavior: Behavior normal.     Ortho Exam left  knee was not hot red warm or swollen.  No effusion.  Little bit of anterior medial joint pain.  No popping or clicking.  No opening with a varus or valgus stress.  Negative anterior drawer sign.  No patella pain or lateral joint discomfort.  Full extension of flexed about 105 degrees.  No popliteal pain or mass.  No calf pain.  Neurologically intact  Specialty Comments:  No specialty comments available.  Imaging: No results found.   PMFS History: Patient Active Problem List   Diagnosis Date Noted  . Pain in left knee 11/28/2020  . Encounter for screening colonoscopy 12/25/2016  . Screening for diabetes mellitus (DM) 01/02/2015  . Unstable angina ([place])   . Hyperlipidemia 04/27/2014  . HTN (hypertension) 04/27/2014  . Chest pain 04/26/2014  . Elevated blood pressure reading without diagnosis of hypertension 04/26/2014  . Hypothyroidism, postradioiodine therapy 04/26/2014  . Tobacco abuse 04/26/2014   Past Medical History:  Diagnosis Date  .  Coronary artery disease    Cath 12/12/2014 EF 65%, 99% prox RCA s/p DESx2, 60-70% mid RCA, 99% LCx marginal s/p DES  . HTN (hypertension) 04/27/2014  . Hyperlipidemia 04/27/2014  . Hyperthyroidism   . Hypothyroidism, postradioiodine therapy    "had radiation; no thyroid now"  . Tobacco abuse     Family History  Problem Relation Age of Onset  . Coronary artery disease Father   . Valvular heart disease Father     Past Surgical History:  Procedure Laterality Date  . [place]; 1985  . COLONOSCOPY N/A 02/18/2017   Procedure: COLONOSCOPY;  Surgeon: Rogene Houston, MD;  Location: [facility] ENDO SUITE;  Service: Endoscopy;  Laterality: N/A;  1:00  . CORONARY ANGIOPLASTY WITH STENT PLACEMENT  12/12/2014   "3"  . LEFT HEART CATHETERIZATION WITH CORONARY ANGIOGRAM N/A 12/12/2014   Procedure: LEFT HEART CATHETERIZATION WITH CORONARY ANGIOGRAM;  Surgeon: Troy Sine, MD;  Location: [facility] CATH LAB;  Service: Cardiovascular;  Laterality: N/A;  . POLYPECTOMY  02/18/2017   Procedure: POLYPECTOMY;  Surgeon: Rogene Houston, MD;  Location: [facility] ENDO SUITE;  Service: Endoscopy;;  sigmoid  . TONSILLECTOMY  ~ 1979  . TUBAL LIGATION  1985   Social History   Occupational History  . Not on file  Tobacco Use  . Smoking status: Current Every Day Smoker    Packs/day: 0.50    Years: 20.00    Pack years: 10.00    Types: Cigarettes    Start date: 01/14/1993  . Smokeless tobacco: Never Used  . Tobacco comment: LESS THAN 1/2 PACK x 15 yrs  Substance and Sexual Activity  . Alcohol use: No    Alcohol/week: 0.0 standard drinks  . Drug use: No  . Sexual activity: Not Currently

## 2021-09-03 ENCOUNTER — Other Ambulatory Visit: Payer: Self-pay | Admitting: Internal Medicine

## 2021-09-03 DIAGNOSIS — Z139 Encounter for screening, unspecified: Secondary | ICD-10-CM

## 2021-09-10 ENCOUNTER — Ambulatory Visit
Admission: RE | Admit: 2021-09-10 | Discharge: 2021-09-10 | Disposition: A | Discharge status: Home / Self Care | Payer: BC Managed Care – PPO | Source: Ambulatory Visit | Attending: Internal Medicine | Admitting: Internal Medicine

## 2021-09-10 ENCOUNTER — Other Ambulatory Visit: Payer: Self-pay

## 2021-09-10 DIAGNOSIS — Z139 Encounter for screening, unspecified: Secondary | ICD-10-CM

## 2022-09-10 ENCOUNTER — Other Ambulatory Visit: Payer: Self-pay | Admitting: Internal Medicine

## 2022-09-10 DIAGNOSIS — Z1231 Encounter for screening mammogram for malignant neoplasm of breast: Secondary | ICD-10-CM

## 2023-04-06 IMAGING — MG MM DIGITAL SCREENING BILAT W/ TOMO AND CAD
8 series · 8 of 24 positions shown · non-contrast
Comparison: None.

CLINICAL DATA: Screening.

EXAM:
DIGITAL SCREENING BILATERAL MAMMOGRAM WITH TOMOSYNTHESIS AND CAD
TECHNIQUE: Bilateral screening digital craniocaudal and mediolateral oblique
mammograms were obtained. Bilateral screening digital breast
tomosynthesis was performed. The images were evaluated with
computer-aided detection.

[R CC synth-2D]
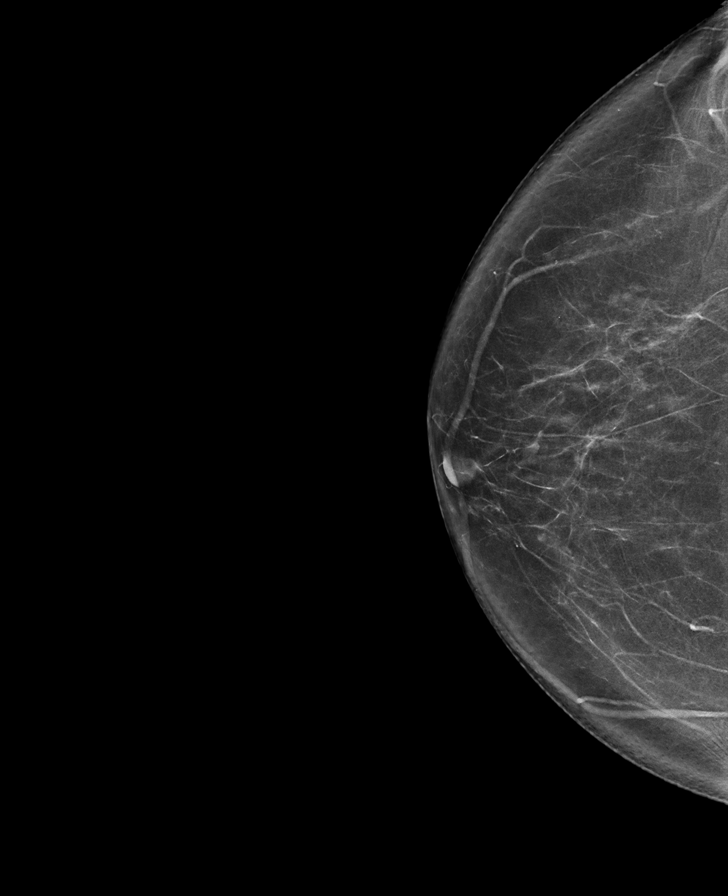

[R MLO synth-2D]
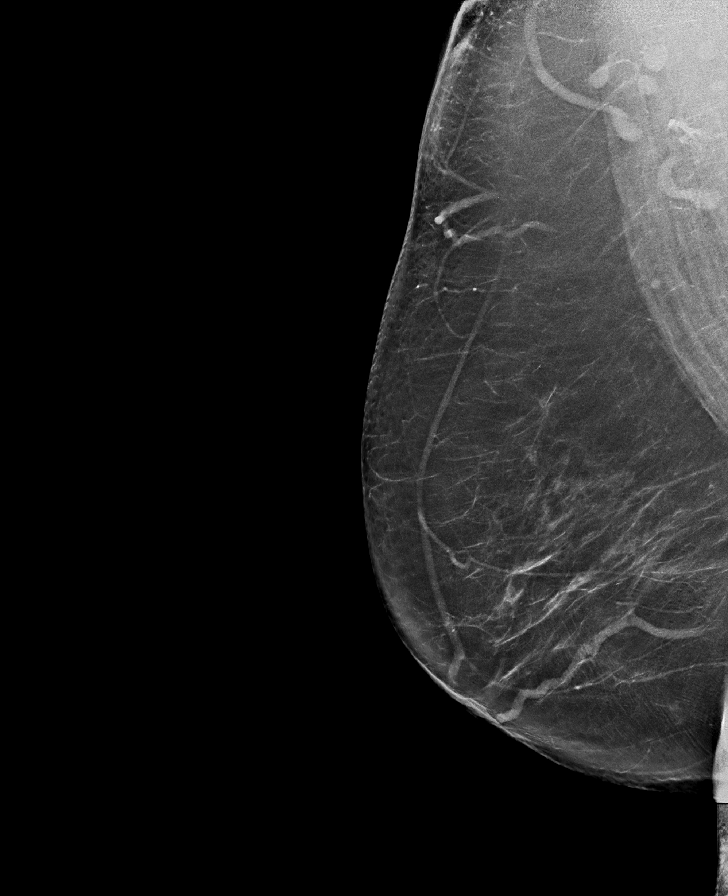

[L CC synth-2D]
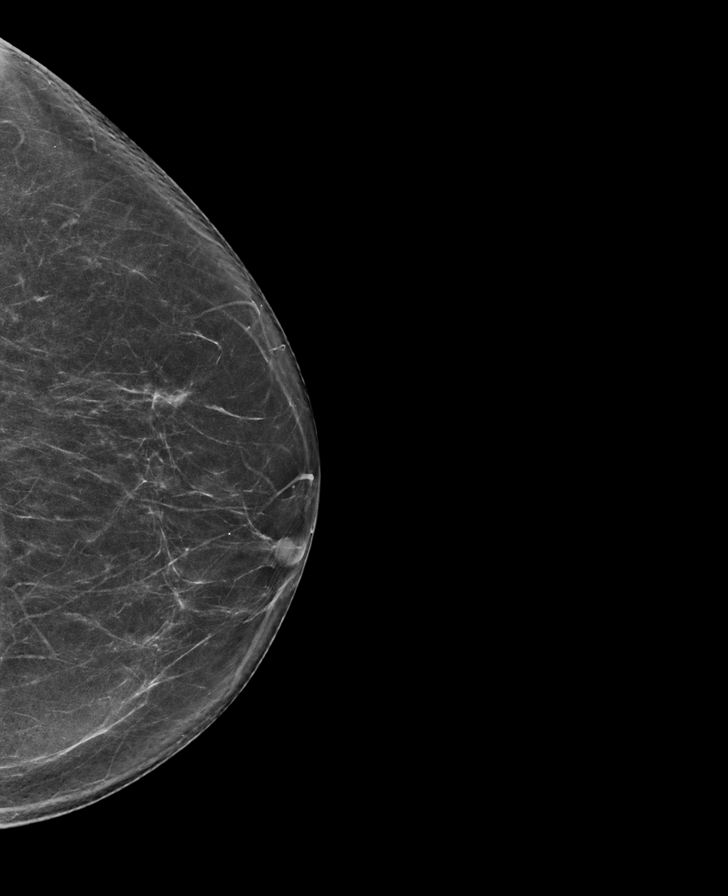

[L MLO synth-2D]
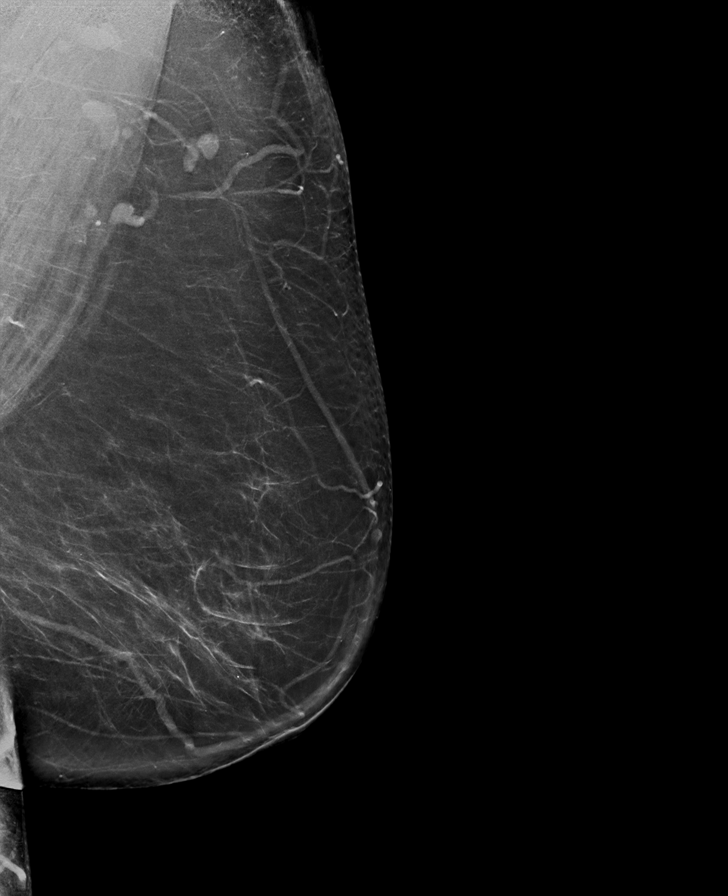

[R MLO tomo · tomo slice 43/85.0]
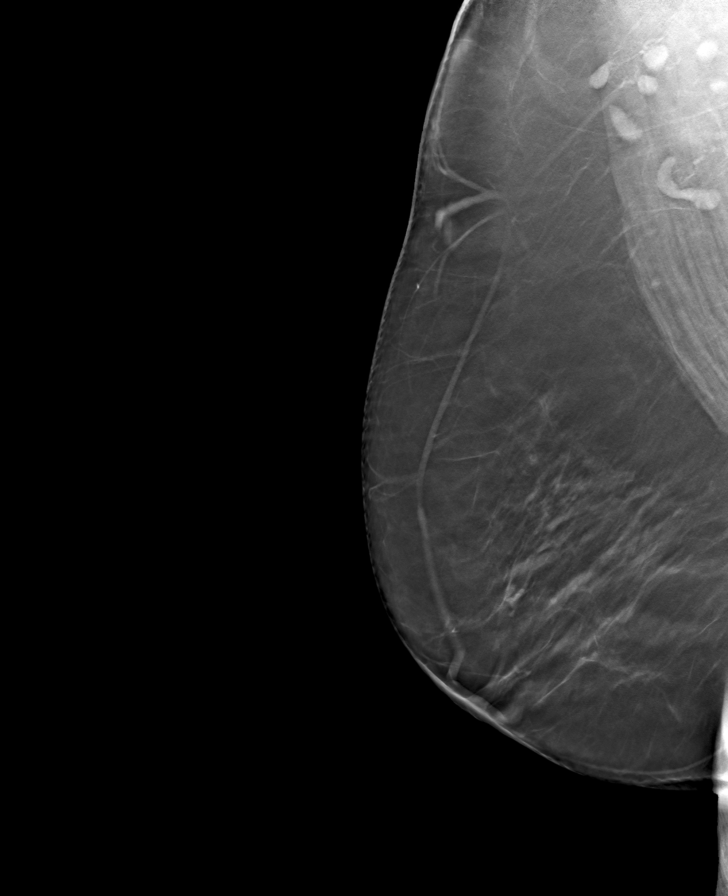

[L MLO tomo · tomo slice 43/86.0]
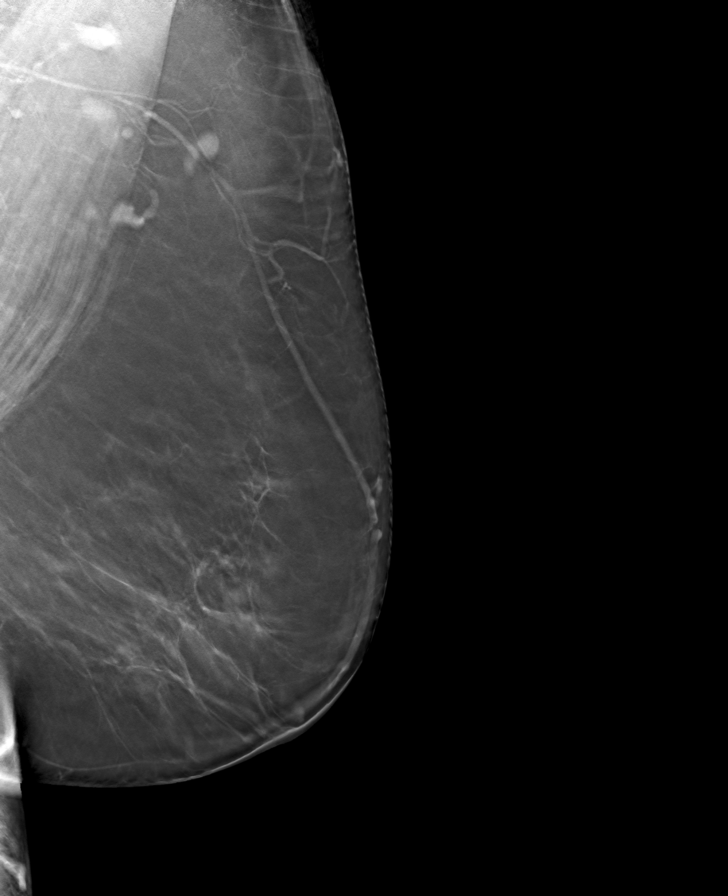

[R CC tomo · tomo slice 41/80.0]
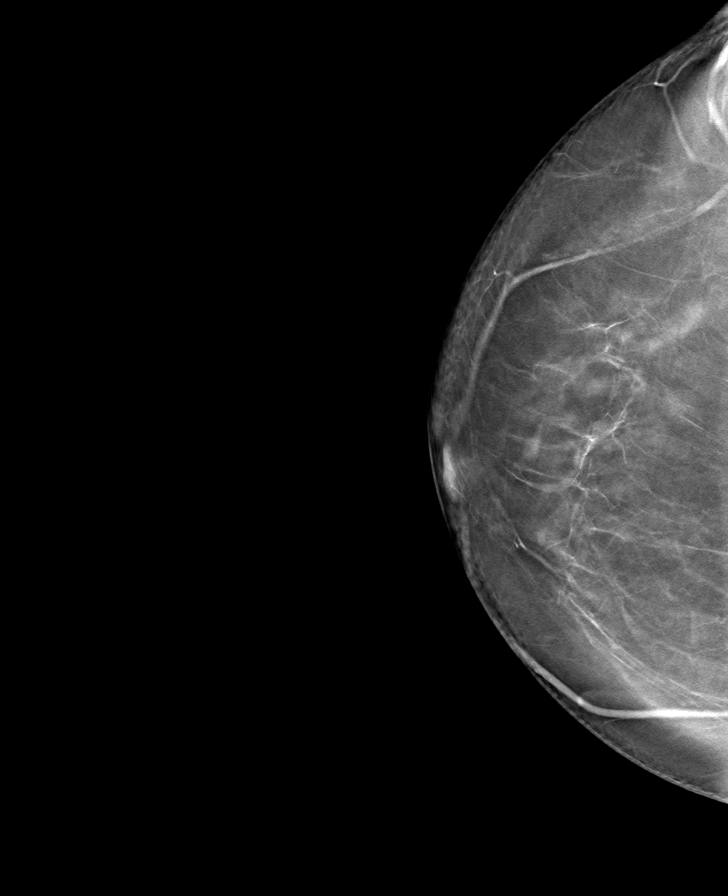

[L CC tomo · tomo slice 37/74.0]
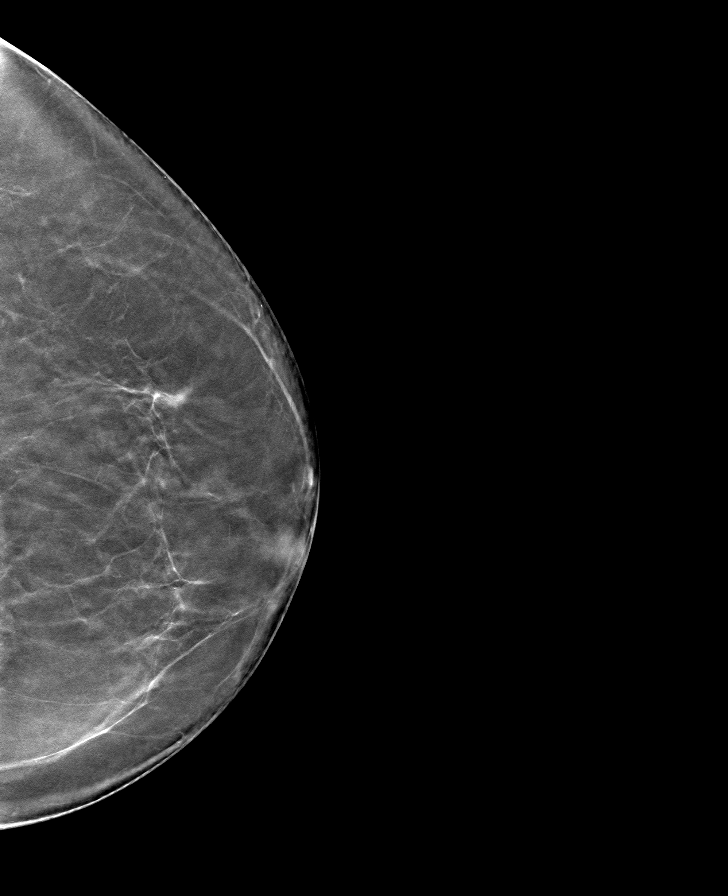

[8 of 24 positions shown; findings below may reference images not displayed]

ACR Breast Density Category b: There are scattered areas of
fibroglandular density.
FINDINGS: There are no findings suspicious for malignancy.
IMPRESSION: No mammographic evidence of malignancy. A result letter of this
screening mammogram will be mailed directly to the patient.

RECOMMENDATION:
Screening mammogram in one year. (Code:XG-X-X7B)

BI-RADS CATEGORY  1: Negative.

## 2024-01-22 ENCOUNTER — Encounter (INDEPENDENT_AMBULATORY_CARE_PROVIDER_SITE_OTHER): Payer: Self-pay | Admitting: *Deleted

## 2024-07-29 ENCOUNTER — Other Ambulatory Visit (HOSPITAL_BASED_OUTPATIENT_CLINIC_OR_DEPARTMENT_OTHER): Payer: Self-pay

## 2024-07-29 MED ORDER — CANDESARTAN CILEXETIL 32 MG PO TABS
32.0000 mg | ORAL_TABLET | Freq: Every day | ORAL | 3 refills | Status: DC
  Filled 2024-07-29: qty 30, 30d supply, fill #0
  Filled 2024-09-28: qty 30, 30d supply, fill #1
  Filled 2024-10-24: qty 30, 30d supply, fill #2
  Filled 2024-11-23: qty 30, 30d supply, fill #3

## 2024-07-29 MED ORDER — METOPROLOL TARTRATE 50 MG PO TABS
50.0000 mg | ORAL_TABLET | Freq: Two times a day (BID) | ORAL | 3 refills | Status: DC
  Filled 2024-07-29: qty 60, 30d supply, fill #0

## 2024-08-01 ENCOUNTER — Other Ambulatory Visit (HOSPITAL_BASED_OUTPATIENT_CLINIC_OR_DEPARTMENT_OTHER): Payer: Self-pay

## 2024-08-01 MED ORDER — CANDESARTAN CILEXETIL 32 MG PO TABS
32.0000 mg | ORAL_TABLET | Freq: Every day | ORAL | 3 refills | Status: DC
  Filled 2024-08-01: qty 30, 30d supply, fill #0

## 2024-08-01 MED ORDER — METOPROLOL TARTRATE 50 MG PO TABS
50.0000 mg | ORAL_TABLET | Freq: Two times a day (BID) | ORAL | 3 refills | Status: DC
  Filled 2024-08-01: qty 60, 30d supply, fill #0

## 2024-08-01 MED ORDER — LEVOTHYROXINE SODIUM 100 MCG PO TABS
100.0000 ug | ORAL_TABLET | Freq: Every day | ORAL | 1 refills | Status: AC
  Filled 2024-08-01: qty 90, 90d supply, fill #0
  Filled 2024-11-03: qty 90, 90d supply, fill #1

## 2024-08-02 ENCOUNTER — Other Ambulatory Visit (HOSPITAL_BASED_OUTPATIENT_CLINIC_OR_DEPARTMENT_OTHER): Payer: Self-pay

## 2024-08-03 ENCOUNTER — Encounter: Payer: Self-pay | Admitting: Cardiology

## 2024-08-03 ENCOUNTER — Ambulatory Visit: Attending: Cardiology | Admitting: Cardiology

## 2024-08-03 VITALS — BP 218/100 | HR 60 | Ht 65.0 in | Wt 209.6 lb

## 2024-08-03 DIAGNOSIS — E782 Mixed hyperlipidemia: Secondary | ICD-10-CM

## 2024-08-03 DIAGNOSIS — I10 Essential (primary) hypertension: Secondary | ICD-10-CM

## 2024-08-03 DIAGNOSIS — I2510 Atherosclerotic heart disease of native coronary artery without angina pectoris: Secondary | ICD-10-CM | POA: Diagnosis not present

## 2024-08-03 NOTE — Patient Instructions (Addendum)
 Medication Instructions:  Your physician recommends that you continue on your current medications as directed. Please refer to the Current Medication list given to you today.  *If you need a refill on your cardiac medications before your next appointment, please call your pharmacy*  Lab Work: None If you have labs (blood work) drawn today and your tests are completely normal, you will receive your results only by: MyChart Message (if you have MyChart) OR A paper copy in the mail If you have any lab test that is abnormal or we need to change your treatment, we will call you to review the results.  Testing/Procedures: None  Follow-Up: At [facility], you and your health needs are our priority.  As part of our continuing mission to provide you with exceptional heart care, our providers are all part of one team.  This team includes your primary Cardiologist (physician) and Advanced Practice Providers or APPs (Physician Assistants and Nurse Practitioners) who all work together to provide you with the care you need, when you need it.  Your next appointment:   4 week(s)  Provider:   Almarie Crate, NP   1 week Nurse Visit- blood pressure check   We recommend signing up for the patient portal called MyChart.  Sign up information is provided on this After Visit Summary.  MyChart is used to connect with patients for Virtual Visits (Telemedicine).  Patients are able to view lab/test results, encounter notes, upcoming appointments, etc.  Non-urgent messages can be sent to your provider as well.   To learn more about what you can do with MyChart, go to [email or web address].   Other Instructions   Cooking With Less Salt Cooking with less salt is one way to reduce the amount of salt (sodium) you get from food. Most people should have less than 2,300 milligrams (mg) of sodium each day. If you have high blood pressure (hypertension), you may need to limit your sodium to 1,500 mg each  day. Follow the tips below to help reduce your sodium intake. What are tips for eating less sodium? Reading food labels  Check the food label before buying or using packaged ingredients. Always check the label for the serving size and sodium content. Choose products with less than 140 mg of sodium per serving. Check the % Daily Value column to see what percent of the daily recommended amount of sodium is in one serving of the product. Foods with 5% or less are low in sodium. Foods with 20% or more are high in sodium. Do not choose foods that have salt as one of the first three ingredients on the ingredients list. Always check how much sodium is in a product, even if the label says unsalted or no salt added. Shopping Buy sodium-free or low-sodium products. Look for these words: Low-sodium. Sodium-free. Reduced-sodium. No salt added. Unsalted. Buy fresh or frozen foods without sauces or additives. Cooking Instead of salt, use herbs, seasonings without salt, and spices. Use sodium-free baking soda. Grill, braise, or roast foods to add flavor with less salt. Do not add salt to pasta, rice, or hot cereals. Drain and rinse canned vegetables, beans, and meat before use. Do not add salt when cooking sweets and desserts. Cook with low-sodium ingredients. Meal planning The sodium in bread can add up. Try to plan meals with other grains. These may include whole oats, quinoa, whole wheat pasta, and other whole grains that do not have sodium added to them. What foods are high in sodium? Vegetables  Regular canned vegetables, except low-sodium or reduced-sodium items. Sauerkraut, pickled vegetables, and relishes. Olives. [place] fries. Onion rings. Regular canned tomato sauce and paste. Regular tomato and vegetable juice. Frozen vegetables in sauces. Grains Instant hot cereals. Bread stuffing, pancake, and biscuit mixes. Croutons. Seasoned rice or pasta mixes. Noodle soup cups. Boxed or frozen  macaroni and cheese. Regular salted crackers. Self-rising flour. Rolls. Bagels. Flour tortillas and wraps. Meats and other proteins Meat or fish that is salted, canned, smoked, cured, spiced, or pickled. Precooked or cured meat, such as sausages or meat loaves. [place]. Ham. Pepperoni. Hot dogs. Corned beef. Chipped beef. Salt pork. Jerky. Pickled herring, anchovies, and sardines. Regular canned tuna. Salted nuts. Dairy Processed cheese and cheese spreads. Hard cheeses. Cheese curds. Blue cheese. Feta cheese. String cheese. Regular cottage cheese. Buttermilk. Canned milk. The items listed above may not be a full list of foods high in sodium. Talk to a dietitian to learn more. What foods are low in sodium? Fruits Fresh, frozen, or canned fruit with no sauce added. Fruit juice. Vegetables Fresh or frozen vegetables with no sauce added. No salt added canned vegetables. No salt added tomato sauce and paste. Low-sodium or reduced-sodium tomato and vegetable juice. Grains Noodles, pasta, quinoa, rice. Shredded or puffed wheat or puffed rice. Regular or quick oats (not instant). Low-sodium crackers. Low-sodium bread. Whole grain bread and whole grain pasta. Unsalted popcorn. Meats and other proteins Fresh or frozen whole meats, poultry that has not been injected with sodium, and fish with no sauce added. Unsalted nuts. Dried peas, beans, and lentils without added salt. Unsalted canned beans. Eggs. Unsalted nut butters. Low-sodium canned tuna or chicken. Dairy Milk. Soy milk. Yogurt. Low-sodium cheeses, such as Swiss, Monte[place] Jack, [place], and ricotta. Sherbet or ice cream (keep to  cup per serving). Cream cheese. Fats and oils Unsalted butter or margarine. Other foods Homemade pudding. Sodium-free baking soda and baking powder. Herbs and spices. Low-sodium seasoning mixes. Beverages Coffee and tea. Carbonated beverages. The items listed above may not be a full list of foods low in sodium.  Talk to a dietitian to learn more. What are some salt alternatives when cooking? Herbs, seasonings, and spices can be used instead of salt to flavor your food. Herbs should be fresh or dried. Do not choose packaged mixes. Next to the name of the herb, spice, or seasoning below are some foods you can pair it with. Herbs [place] leaves - Soups, meat and vegetable dishes, and spaghetti sauce. Basil - [employer], soups, pasta, and fish dishes. Cilantro - Meat, poultry, and vegetable dishes. Chili powder - Marinades and Mexican dishes. Chives - Salad dressings and potato dishes. Cumin - Mexican dishes, couscous, and meat dishes. Dill - Fish dishes, sauces, and salads. Fennel - Meat and vegetable dishes, breads, and cookies. Garlic (do not use garlic salt) - [place] dishes, meat dishes, salad dressings, and sauces. Marjoram - Soups, potato dishes, and meat dishes. Oregano - Pizza and spaghetti sauce. Parsley - Salads, soups, pasta, and meat dishes. Rosemary - [place] dishes, salad dressings, soups, and red meats. Saffron - Fish dishes, pasta, and some poultry dishes. Sage - Stuffings and sauces. Tarragon - Fish and Whole Foods. Thyme - Stuffing, meat, and fish dishes. Seasonings Lemon juice - Fish dishes, poultry dishes, vegetables, and salads. Vinegar - Salad dressings, vegetables, and fish dishes. Spices Cinnamon - Sweet dishes, such as cakes, cookies, and puddings. Cloves - Gingerbread, puddings, and marinades for meats. Curry - Vegetable dishes, fish and poultry dishes,  and stir-fry dishes. Ginger - Vegetable dishes, fish dishes, and stir-fry dishes. Nutmeg - Pasta, vegetables, poultry, fish dishes, and custard. This information is not intended to replace advice given to you by your health care provider. Make sure you discuss any questions you have with your health care provider. Document Revised: 11/13/2022 Document Reviewed: 11/06/2022 Elsevier Patient Education  2024 [employer].

## 2024-08-03 NOTE — Progress Notes (Signed)
 Clinical Summary Ms. Gilland is a 63 y.o.female seen as a new patient, last seen in our office in 06/2015   1. CAD - 12/2014 Lexiscan  with large area of ischemia in lateral and inferolateral walls. Referred for cath - cath 12/2014 LM patent, LAD patent, LCX 20% ostial, OM1 99%, RCA 99% prox with more distal thrombus. - s/p stenting to RCA with DES x 3 and also DES to OM1. LVEF 65% - medical therapy somewhat limited during admission due to low bp's. Myalgias on lipitor  previously, currently on crestor  and tolerating   - no chest pains, no SOB/DOE - compliant with meds     2. Hyperlipidemia - side effects on lipitor , changed to atorva 5mg  daily and tolerating well - she reports recent repeat lipid panel with pcp  - compliant with crestor  and zetia. Believes had prior side effects on crestor  20mg  daily.  -    3. HTN - compliant with meds - from pcp note norvasc caused weakness - recent very high bp's 170s to 220s at pcp's office    4. OSA screen? + snoring, unsure apneic episodes, no daytime somolence though some generalized fatigue   Past Medical History:  Diagnosis Date   Coronary artery disease    Cath 12/12/2014 EF 65%, 99% prox RCA s/p DESx2, 60-70% mid RCA, 99% LCx marginal s/p DES   HTN (hypertension) 04/27/2014   Hyperlipidemia 04/27/2014   Hyperthyroidism    Hypothyroidism, postradioiodine therapy    had radiation; no thyroid now   Tobacco abuse      Allergies  Allergen Reactions   Prednisone Other (See Comments)    Made aggressive.     Current Outpatient Medications  Medication Sig Dispense Refill   acetaminophen  (TYLENOL ) 500 MG tablet Take 1-2 tablets (500-1,000 mg total) by mouth every 6 (six) hours as needed. (Patient not taking: Reported on 11/28/2020)     aspirin  EC 81 MG tablet Take 1 tablet (81 mg total) by mouth daily.     Biotin 1 MG CAPS Take 1 capsule by mouth daily.      candesartan  (ATACAND ) 32 MG tablet Take 1 tablet (32 mg total) by  mouth daily. 30 tablet 3   candesartan  (ATACAND ) 32 MG tablet 1 tab by mouth daily 30 tablet 3   ezetimibe (ZETIA) 10 MG tablet Take 10 mg by mouth daily.     levothyroxine  (LEVOXYL ) 100 MCG tablet Take 1 tablet (100 mcg total) by mouth daily. 90 tablet 1   levothyroxine  (SYNTHROID , LEVOTHROID) 88 MCG tablet Take 88 mcg by mouth daily before breakfast.   0   metoprolol  tartrate (LOPRESSOR ) 25 MG tablet take 1/2 tablet by mouth twice a day START WHEN SBP IS GREATER THAN 110 30 tablet 0   metoprolol  tartrate (LOPRESSOR ) 50 MG tablet Take 1 tablet (50 mg total) by mouth 2 (two) times daily. 60 tablet 3   metoprolol  tartrate (LOPRESSOR ) 50 MG tablet Take 1 tablet (50 mg total) by mouth 2 (two) times daily. 60 tablet 3   nitroGLYCERIN  (NITROSTAT ) 0.4 MG SL tablet Place 1 tablet (0.4 mg total) under the tongue every 5 (five) minutes as needed for chest pain. 25 tablet 3   rosuvastatin  (CRESTOR ) 20 MG tablet Take 20 mg by mouth every evening.     ticagrelor  (BRILINTA ) 90 MG TABS tablet Take 1 tablet (90 mg total) by mouth daily. 1 tablet 0   No current facility-administered medications for this visit.     Past Surgical History:  Procedure Laterality Date   CESAREAN SECTION  1983; 1985   COLONOSCOPY N/A 02/18/2017   Procedure: COLONOSCOPY;  Surgeon: Claudis RAYMOND Rivet, MD;  Location: [facility];  Service: Endoscopy;  Laterality: N/A;  1:00   CORONARY ANGIOPLASTY WITH STENT PLACEMENT  12/12/2014   3   LEFT HEART CATHETERIZATION WITH CORONARY ANGIOGRAM N/A 12/12/2014   Procedure: LEFT HEART CATHETERIZATION WITH CORONARY ANGIOGRAM;  Surgeon: Debby DELENA Sor, MD;  Location: [facility] CATH LAB;  Service: Cardiovascular;  Laterality: N/A;   POLYPECTOMY  02/18/2017   Procedure: POLYPECTOMY;  Surgeon: Claudis RAYMOND Rivet, MD;  Location: [facility];  Service: Endoscopy;;  sigmoid   TONSILLECTOMY  ~ 1979   TUBAL LIGATION  1985     Allergies  Allergen Reactions   Prednisone Other (See Comments)    Made  aggressive.      Family History  Problem Relation Age of Onset   Coronary artery disease Father    Valvular heart disease Father      Social History Ms. Adami reports that she has been smoking cigarettes. She started smoking about 31 years ago. She has a 15.8 pack-year smoking history. She has never used smokeless tobacco. Ms. Villers reports no history of alcohol use.     Physical Examination Today's Vitals   08/03/24 1314 08/03/24 1322  BP: (!) 222/122 (!) 218/100  Pulse: 60   SpO2: 96%   Weight: 209 lb 9.6 oz (95.1 kg)   Height: 5' 5 (1.651 m)    Body mass index is 34.88 kg/m.  Gen: resting comfortably, no acute distress HEENT: no scleral icterus, pupils equal round and reactive, no palptable cervical adenopathy,  CV: RRR, no mrg, no jvd Resp: Clear to auscultation bilaterally GI: abdomen is soft, non-tender, non-distended, normal bowel sounds, no hepatosplenomegaly MSK: extremities are warm, no edema.  Skin: warm, no rash Neuro:  no focal deficits Psych: appropriate affect   Diagnostic Studies  04/2014 Echo Study Conclusions  - Procedure narrative: Transthoracic echocardiography. Image   quality was suboptimal, due to poor sound transmission. - Left ventricle: The cavity size was normal. Wall thickness was   increased in a pattern of mild LVH. Systolic function was normal.   The estimated ejection fraction was in the range of 60% to 65%.   Wall motion was normal; there were no regional wall motion   abnormalities. Doppler parameters are consistent with abnormal   left ventricular relaxation (grade 1 diastolic dysfunction).   12/2014 Lexiscan  MPI IMPRESSION: 1. Large degree of ischemia in the lateral and inferolateral walls extending from the apex to the base.   2. Severe lateral wall hypokinesis to akinesis.   3. Left ventricular ejection fraction 68%   4.  High-risk stress test findings*.     12/2014 Cath HEMODYNAMICS:    Central Aorta:  120/65   Left Ventricle: 120/19   ANGIOGRAPHY:    The left main coronary artery was angiographically normal and bifurcated into the LAD and left circumflex coronary artery.    The LAD was a moderate size vessel that gave rise to  3 diagonal vessels and several septal perforating arteries.  There was ignificant collateralization to several vessels of the distal RCA via septal perforators as well as via the apical portion of the LAD.   The left circumflex coronary artery was a moderate size vessel that had smooth 20% ostial narrowing.  After a very small proximal Jelitza Manninen, flex gave rise to a large OM vessel.  At the ostium of this  OM vessel.  There was a 99% stenosis.  There was some collateralization to the distal RCA via the distal AV groove circumflex vessel.    The RCA was a large caliber dominant vessel which gave rise to a large PDA and PLA vessel.  There was a 99% stenosis in the proximal bend of the RCA.  Immediately beyond this was an area of haziness due to thrombus.  There was a 60-70% smooth mid stenosis before the anterior RV marginal Zawadi Aplin.  There was a 90% stenosis immediately proximal to the acute margin.  The PDA had diffuse smooth 50% stenoses.  There was antegrade filling down the PDA and PLA vessel which also had left to right collateral filling.   Left ventriculography revealed hyperdynamic global LV contractility without focal segmental wall motion abnormalities. There was no evidence for mitral regurgitation.  Ejection fraction was 65%.   Following percutaneous coronary intervention with PTCA/DES stenting of the three proximal, mid and mid-distal RCA lesions and insertion of tandem 3.538 mm distally and 3.515 mm Resolute DES stents proximally, the entire proximal to acute margin region was reduced to 0%.  There was brisk TIMI-3 flow.  There was no evidence for dissection.  Repeat angiography in the left coronary system after successful RCA stenting no longer showed the previous  significant collateralization left-to-right since now the RCA antegrade flow was excellent.   Following PTCA/stenting of the circumflex vessel with insertion of a 3.018 mm Resolute integrity DES stent postdilated to 3.25 mm extending from the proximal circumflex into the OM1 vessel, the entire region was reduced to 0%.  There was no evidence for dissection.       IMPRESSION:   Normal left ventricular function with an ejection fraction of 65% without wall motion abnormalities.   Significant 2 vessel coronary obstructive disease with normal left main and LAD; 20% smooth ostial narrowing of the circumflex with 99% circumflex marginal stenosis with initial significant 3 site collateralization to the distal RCA, both via the LAD and circumflex vessel; and 99% proximal RCA stenosis with thrombus, 60-70% mid RCA stenosis and 90% RCA stenosis just proximal to the acute margin and 50% smooth PDA stenoses.   Successful 2 vessel percutaneous coronary intervention with PTCA/stenting of 3 sites in the proximal, mid and mid-distal RCA with ultimate insertion of tandem 3.538 and 3.515 mm Resolute DES stents with the stenoses being reduced to 0%, and successful PTCA/stenting of the circumflex/OM1 vessel with a 99% stenosis being reduced to 0%  With insertion of a 3.018 mm Resolute DES stent     RECOMMENDATION:   Patient will be maintained on dual antiplatelet therapy for a minimum of a year and probably longer.  She will be treated with aggressive statin therapy, beta blocker, ACE inhibitor.   Smoking cessation is imperative.     Assessment and Plan   1. CAD -PCI to RCA and OM1 12/2014 - no recnet symptoms, continue current meds - EKG today shows NSR, no ischemic changes   2. Hyperlipidemia - limitation on statin dosing due to prior side effects, only on crestor  10mg  with zetia 10mg . F/u upcoming labs, may need to consider [id number] if LDL is not <70   3. HTN - severe HTN has recently developed -  continue candesartan . She will retry norvasc 5mg  daily, unclear if truly caused fatigue.  - nursing visit 1 week, titrate norvasc. Next agent would be chlorthalidone. If on 3 agents and uncontrolled would then initiate a secondary HTN workup with renin/aldo, renal artery  US , and sleep eval        Dorn PHEBE Ross, M.D.

## 2024-08-09 ENCOUNTER — Other Ambulatory Visit (HOSPITAL_BASED_OUTPATIENT_CLINIC_OR_DEPARTMENT_OTHER): Payer: Self-pay

## 2024-08-09 ENCOUNTER — Telehealth: Payer: Self-pay | Admitting: *Deleted

## 2024-08-09 ENCOUNTER — Encounter: Payer: Self-pay | Admitting: *Deleted

## 2024-08-09 ENCOUNTER — Ambulatory Visit: Attending: Cardiology | Admitting: *Deleted

## 2024-08-09 VITALS — BP 148/70 | HR 50 | Ht 65.0 in | Wt 208.2 lb

## 2024-08-09 DIAGNOSIS — I10 Essential (primary) hypertension: Secondary | ICD-10-CM | POA: Diagnosis not present

## 2024-08-09 MED ORDER — AMLODIPINE BESYLATE 10 MG PO TABS
10.0000 mg | ORAL_TABLET | Freq: Every day | ORAL | 3 refills | Status: AC
  Filled 2024-08-09: qty 90, 90d supply, fill #0
  Filled 2024-11-10: qty 90, 90d supply, fill #1

## 2024-08-09 NOTE — Patient Instructions (Signed)
 Your physician recommends that you continue on your current medications as directed. Please refer to the Current Medication list given to you today. Your physician recommends that you schedule a follow-up appointment as planned. You will be contacted after your provider gives recommendations.

## 2024-08-09 NOTE — Progress Notes (Signed)
 Presents for nurse visit to recheck blood pressure per last visit request. Medications reviewed. Reports taking all medications as prescribed without missing doses or side effects. Denies dizziness, chest pain or chest pain. Reports she continues to have a headache but its improved and is no longer feels like its pounding when she wakes up in the mornings. Vitals done and routed to provider for review.

## 2024-08-09 NOTE — Telephone Encounter (Signed)
 Patient informed and verbalized understanding of plan.

## 2024-08-09 NOTE — Telephone Encounter (Signed)
-----   Message from Almarie Crate sent at 08/09/2024 10:26 AM EDT ----- Chart reviewed. Last seen by Dr. Alvan on 08/03/2024 with severely elevated BP. BP is much improved today. SBP still slightly above goal. Recommend increasing Norvasc to 10 mg daily and to bring back in for BP check in 2-3 weeks. Recommend to monitor BP at home at least 2 hours after medications and sitting for 5-10 minutes and to provide a salty six sheet to her.   Thanks!   Best, Almarie Crate, NP

## 2024-08-15 ENCOUNTER — Other Ambulatory Visit (HOSPITAL_BASED_OUTPATIENT_CLINIC_OR_DEPARTMENT_OTHER): Payer: Self-pay

## 2024-09-02 ENCOUNTER — Other Ambulatory Visit (HOSPITAL_BASED_OUTPATIENT_CLINIC_OR_DEPARTMENT_OTHER): Payer: Self-pay

## 2024-09-05 ENCOUNTER — Other Ambulatory Visit (HOSPITAL_BASED_OUTPATIENT_CLINIC_OR_DEPARTMENT_OTHER): Payer: Self-pay

## 2024-09-05 MED ORDER — METOPROLOL TARTRATE 50 MG PO TABS
50.0000 mg | ORAL_TABLET | Freq: Two times a day (BID) | ORAL | 3 refills | Status: AC
  Filled 2024-09-05: qty 60, 30d supply, fill #0

## 2024-09-07 ENCOUNTER — Other Ambulatory Visit (HOSPITAL_BASED_OUTPATIENT_CLINIC_OR_DEPARTMENT_OTHER): Payer: Self-pay

## 2024-09-16 ENCOUNTER — Ambulatory Visit: Admitting: Nurse Practitioner

## 2024-09-28 ENCOUNTER — Other Ambulatory Visit (HOSPITAL_BASED_OUTPATIENT_CLINIC_OR_DEPARTMENT_OTHER): Payer: Self-pay

## 2024-10-24 ENCOUNTER — Other Ambulatory Visit (HOSPITAL_BASED_OUTPATIENT_CLINIC_OR_DEPARTMENT_OTHER): Payer: Self-pay

## 2024-11-04 ENCOUNTER — Other Ambulatory Visit (HOSPITAL_BASED_OUTPATIENT_CLINIC_OR_DEPARTMENT_OTHER): Payer: Self-pay

## 2024-11-11 ENCOUNTER — Other Ambulatory Visit (HOSPITAL_BASED_OUTPATIENT_CLINIC_OR_DEPARTMENT_OTHER): Payer: Self-pay

## 2024-11-16 ENCOUNTER — Ambulatory Visit: Admitting: Cardiology

## 2024-11-23 ENCOUNTER — Other Ambulatory Visit (HOSPITAL_BASED_OUTPATIENT_CLINIC_OR_DEPARTMENT_OTHER): Payer: Self-pay

## 2024-11-24 ENCOUNTER — Other Ambulatory Visit (HOSPITAL_BASED_OUTPATIENT_CLINIC_OR_DEPARTMENT_OTHER): Payer: Self-pay

## 2024-11-24 MED ORDER — CANDESARTAN CILEXETIL 32 MG PO TABS
32.0000 mg | ORAL_TABLET | Freq: Every day | ORAL | 0 refills | Status: AC
  Filled 2024-11-24: qty 30, 30d supply, fill #0

## 2024-11-25 ENCOUNTER — Other Ambulatory Visit (HOSPITAL_BASED_OUTPATIENT_CLINIC_OR_DEPARTMENT_OTHER): Payer: Self-pay

## 2024-11-28 ENCOUNTER — Other Ambulatory Visit (HOSPITAL_BASED_OUTPATIENT_CLINIC_OR_DEPARTMENT_OTHER): Payer: Self-pay

## 2024-12-08 ENCOUNTER — Other Ambulatory Visit (HOSPITAL_BASED_OUTPATIENT_CLINIC_OR_DEPARTMENT_OTHER): Payer: Self-pay

## 2024-12-23 ENCOUNTER — Ambulatory Visit: Admitting: Nurse Practitioner
